# Patient Record
Sex: Female | Born: 1960 | Race: Black or African American | Hispanic: No | Marital: Married
Health system: Southern US, Community
[De-identification: ages and names within clinical notes are randomized; demographics above are authoritative.]

---

## 1998-10-14 ENCOUNTER — Ambulatory Visit (HOSPITAL_COMMUNITY)
Admission: RE | Admit: 1998-10-14 | Discharge: 1998-10-14 | Payer: Self-pay | Admitting: Physical Medicine & Rehabilitation

## 1998-10-14 ENCOUNTER — Encounter: Payer: Self-pay | Admitting: Physical Medicine & Rehabilitation

## 1998-10-21 ENCOUNTER — Encounter: Payer: Self-pay | Admitting: Physical Medicine & Rehabilitation

## 1998-10-21 ENCOUNTER — Ambulatory Visit (HOSPITAL_COMMUNITY)
Admission: RE | Admit: 1998-10-21 | Discharge: 1998-10-21 | Payer: Self-pay | Admitting: Physical Medicine & Rehabilitation

## 1998-11-07 ENCOUNTER — Ambulatory Visit (HOSPITAL_COMMUNITY): Admission: RE | Admit: 1998-11-07 | Discharge: 1998-11-07 | Payer: Self-pay | Admitting: Obstetrics and Gynecology

## 1998-11-07 ENCOUNTER — Encounter: Payer: Self-pay | Admitting: Physical Medicine & Rehabilitation

## 1998-11-21 ENCOUNTER — Encounter: Payer: Self-pay | Admitting: Physical Medicine & Rehabilitation

## 1998-11-21 ENCOUNTER — Ambulatory Visit (HOSPITAL_COMMUNITY)
Admission: RE | Admit: 1998-11-21 | Discharge: 1998-11-21 | Payer: Self-pay | Admitting: Physical Medicine & Rehabilitation

## 2003-02-07 ENCOUNTER — Encounter
Admission: RE | Admit: 2003-02-07 | Discharge: 2003-05-08 | Payer: Self-pay | Admitting: Physical Medicine & Rehabilitation

## 2003-05-17 ENCOUNTER — Encounter
Admission: RE | Admit: 2003-05-17 | Discharge: 2003-08-15 | Payer: Self-pay | Admitting: Physical Medicine & Rehabilitation

## 2003-09-17 ENCOUNTER — Encounter
Admission: RE | Admit: 2003-09-17 | Discharge: 2003-12-16 | Payer: Self-pay | Admitting: Physical Medicine & Rehabilitation

## 2004-01-16 ENCOUNTER — Encounter
Admission: RE | Admit: 2004-01-16 | Discharge: 2004-04-15 | Payer: Self-pay | Admitting: Physical Medicine & Rehabilitation

## 2004-04-13 ENCOUNTER — Encounter
Admission: RE | Admit: 2004-04-13 | Discharge: 2004-07-12 | Payer: Self-pay | Admitting: Physical Medicine & Rehabilitation

## 2004-07-14 ENCOUNTER — Encounter
Admission: RE | Admit: 2004-07-14 | Discharge: 2004-10-12 | Payer: Self-pay | Admitting: Physical Medicine & Rehabilitation

## 2004-07-15 ENCOUNTER — Ambulatory Visit: Payer: Self-pay | Admitting: Physical Medicine & Rehabilitation

## 2004-10-14 ENCOUNTER — Encounter
Admission: RE | Admit: 2004-10-14 | Discharge: 2005-01-12 | Payer: Self-pay | Admitting: Physical Medicine & Rehabilitation

## 2004-10-14 ENCOUNTER — Ambulatory Visit: Payer: Self-pay | Admitting: Physical Medicine & Rehabilitation

## 2005-02-03 ENCOUNTER — Encounter
Admission: RE | Admit: 2005-02-03 | Discharge: 2005-05-04 | Payer: Self-pay | Admitting: Physical Medicine & Rehabilitation

## 2005-02-10 ENCOUNTER — Ambulatory Visit: Payer: Self-pay | Admitting: Physical Medicine & Rehabilitation

## 2005-03-11 ENCOUNTER — Ambulatory Visit (HOSPITAL_COMMUNITY): Admission: RE | Admit: 2005-03-11 | Discharge: 2005-03-11 | Payer: Self-pay | Admitting: Obstetrics and Gynecology

## 2005-06-09 ENCOUNTER — Ambulatory Visit: Payer: Self-pay | Admitting: Physical Medicine & Rehabilitation

## 2005-06-09 ENCOUNTER — Encounter
Admission: RE | Admit: 2005-06-09 | Discharge: 2005-09-07 | Payer: Self-pay | Admitting: Physical Medicine & Rehabilitation

## 2005-10-05 ENCOUNTER — Encounter
Admission: RE | Admit: 2005-10-05 | Discharge: 2006-01-03 | Payer: Self-pay | Admitting: Physical Medicine & Rehabilitation

## 2005-10-13 ENCOUNTER — Ambulatory Visit: Payer: Self-pay | Admitting: Physical Medicine & Rehabilitation

## 2005-11-09 ENCOUNTER — Encounter: Admission: RE | Admit: 2005-11-09 | Discharge: 2005-11-09 | Payer: Self-pay | Admitting: Cardiovascular Disease

## 2006-02-10 ENCOUNTER — Ambulatory Visit: Payer: Self-pay | Admitting: Physical Medicine & Rehabilitation

## 2006-02-10 ENCOUNTER — Encounter
Admission: RE | Admit: 2006-02-10 | Discharge: 2006-05-11 | Payer: Self-pay | Admitting: Physical Medicine & Rehabilitation

## 2006-04-19 ENCOUNTER — Emergency Department (HOSPITAL_COMMUNITY): Admission: EM | Admit: 2006-04-19 | Discharge: 2006-04-19 | Payer: Self-pay | Admitting: Emergency Medicine

## 2006-04-20 ENCOUNTER — Emergency Department (HOSPITAL_COMMUNITY): Admission: EM | Admit: 2006-04-20 | Discharge: 2006-04-20 | Payer: Self-pay | Admitting: Emergency Medicine

## 2006-04-20 ENCOUNTER — Ambulatory Visit: Payer: Self-pay | Admitting: Physical Medicine & Rehabilitation

## 2006-04-23 ENCOUNTER — Ambulatory Visit (HOSPITAL_COMMUNITY)
Admission: RE | Admit: 2006-04-23 | Discharge: 2006-04-23 | Payer: Self-pay | Admitting: Physical Medicine & Rehabilitation

## 2006-06-08 ENCOUNTER — Encounter
Admission: RE | Admit: 2006-06-08 | Discharge: 2006-09-06 | Payer: Self-pay | Admitting: Physical Medicine & Rehabilitation

## 2006-06-08 ENCOUNTER — Ambulatory Visit: Payer: Self-pay | Admitting: Physical Medicine & Rehabilitation

## 2006-08-05 ENCOUNTER — Ambulatory Visit: Payer: Self-pay | Admitting: Physical Medicine & Rehabilitation

## 2006-10-21 ENCOUNTER — Ambulatory Visit: Payer: Self-pay | Admitting: Physical Medicine & Rehabilitation

## 2006-10-21 ENCOUNTER — Encounter
Admission: RE | Admit: 2006-10-21 | Discharge: 2007-01-19 | Payer: Self-pay | Admitting: Physical Medicine & Rehabilitation

## 2006-10-27 ENCOUNTER — Ambulatory Visit (HOSPITAL_COMMUNITY)
Admission: RE | Admit: 2006-10-27 | Discharge: 2006-10-27 | Payer: Self-pay | Admitting: Physical Medicine & Rehabilitation

## 2006-12-13 ENCOUNTER — Ambulatory Visit: Payer: Self-pay | Admitting: Physical Medicine & Rehabilitation

## 2006-12-28 ENCOUNTER — Encounter: Admission: RE | Admit: 2006-12-28 | Discharge: 2006-12-28 | Payer: Self-pay | Admitting: Cardiovascular Disease

## 2007-02-17 ENCOUNTER — Encounter (INDEPENDENT_AMBULATORY_CARE_PROVIDER_SITE_OTHER): Payer: Self-pay | Admitting: Cardiovascular Disease

## 2007-02-17 ENCOUNTER — Inpatient Hospital Stay (HOSPITAL_COMMUNITY): Admission: AD | Admit: 2007-02-17 | Discharge: 2007-02-20 | Payer: Self-pay | Admitting: Cardiovascular Disease

## 2007-02-17 ENCOUNTER — Ambulatory Visit: Payer: Self-pay | Admitting: Vascular Surgery

## 2007-03-13 ENCOUNTER — Encounter
Admission: RE | Admit: 2007-03-13 | Discharge: 2007-03-15 | Payer: Self-pay | Admitting: Physical Medicine & Rehabilitation

## 2007-03-15 ENCOUNTER — Ambulatory Visit: Payer: Self-pay | Admitting: Physical Medicine & Rehabilitation

## 2007-06-22 ENCOUNTER — Encounter: Admission: RE | Admit: 2007-06-22 | Discharge: 2007-06-22 | Payer: Self-pay | Admitting: Cardiovascular Disease

## 2007-07-11 ENCOUNTER — Encounter
Admission: RE | Admit: 2007-07-11 | Discharge: 2007-08-24 | Payer: Self-pay | Admitting: Physical Medicine & Rehabilitation

## 2007-07-12 ENCOUNTER — Ambulatory Visit: Payer: Self-pay | Admitting: Physical Medicine & Rehabilitation

## 2007-08-22 ENCOUNTER — Emergency Department (HOSPITAL_COMMUNITY): Admission: EM | Admit: 2007-08-22 | Discharge: 2007-08-22 | Payer: Self-pay | Admitting: Emergency Medicine

## 2007-08-22 ENCOUNTER — Ambulatory Visit: Payer: Self-pay | Admitting: Physical Medicine & Rehabilitation

## 2007-11-07 ENCOUNTER — Ambulatory Visit: Payer: Self-pay | Admitting: Physical Medicine & Rehabilitation

## 2007-11-07 ENCOUNTER — Encounter
Admission: RE | Admit: 2007-11-07 | Discharge: 2007-11-08 | Payer: Self-pay | Admitting: Physical Medicine & Rehabilitation

## 2007-12-13 ENCOUNTER — Emergency Department (HOSPITAL_COMMUNITY): Admission: EM | Admit: 2007-12-13 | Discharge: 2007-12-13 | Payer: Self-pay | Admitting: Emergency Medicine

## 2008-02-26 ENCOUNTER — Encounter
Admission: RE | Admit: 2008-02-26 | Discharge: 2008-02-28 | Payer: Self-pay | Admitting: Physical Medicine & Rehabilitation

## 2008-02-28 ENCOUNTER — Ambulatory Visit: Payer: Self-pay | Admitting: Physical Medicine & Rehabilitation

## 2008-05-08 ENCOUNTER — Emergency Department (HOSPITAL_COMMUNITY): Admission: EM | Admit: 2008-05-08 | Discharge: 2008-05-08 | Payer: Self-pay | Admitting: Emergency Medicine

## 2008-06-26 ENCOUNTER — Encounter
Admission: RE | Admit: 2008-06-26 | Discharge: 2008-09-19 | Payer: Self-pay | Admitting: Physical Medicine & Rehabilitation

## 2008-06-27 ENCOUNTER — Ambulatory Visit: Payer: Self-pay | Admitting: Physical Medicine & Rehabilitation

## 2008-08-20 ENCOUNTER — Observation Stay (HOSPITAL_COMMUNITY): Admission: EM | Admit: 2008-08-20 | Discharge: 2008-08-22 | Payer: Self-pay | Admitting: Emergency Medicine

## 2008-09-12 ENCOUNTER — Inpatient Hospital Stay (HOSPITAL_COMMUNITY): Admission: EM | Admit: 2008-09-12 | Discharge: 2008-09-14 | Payer: Self-pay | Admitting: Emergency Medicine

## 2008-09-12 ENCOUNTER — Ambulatory Visit: Payer: Self-pay | Admitting: Internal Medicine

## 2008-09-13 ENCOUNTER — Encounter: Payer: Self-pay | Admitting: Internal Medicine

## 2008-09-17 ENCOUNTER — Emergency Department (HOSPITAL_COMMUNITY): Admission: EM | Admit: 2008-09-17 | Discharge: 2008-09-17 | Payer: Self-pay | Admitting: Emergency Medicine

## 2008-09-19 ENCOUNTER — Ambulatory Visit: Payer: Self-pay | Admitting: Physical Medicine & Rehabilitation

## 2008-10-18 ENCOUNTER — Encounter
Admission: RE | Admit: 2008-10-18 | Discharge: 2008-10-21 | Payer: Self-pay | Admitting: Physical Medicine & Rehabilitation

## 2008-10-21 ENCOUNTER — Ambulatory Visit: Payer: Self-pay | Admitting: Physical Medicine & Rehabilitation

## 2008-11-29 ENCOUNTER — Emergency Department (HOSPITAL_COMMUNITY): Admission: EM | Admit: 2008-11-29 | Discharge: 2008-11-29 | Payer: Self-pay | Admitting: Emergency Medicine

## 2009-05-14 ENCOUNTER — Encounter: Admission: RE | Admit: 2009-05-14 | Discharge: 2009-05-14 | Payer: Self-pay | Admitting: Internal Medicine

## 2009-09-10 ENCOUNTER — Encounter: Admission: RE | Admit: 2009-09-10 | Discharge: 2009-12-09 | Payer: Self-pay | Admitting: Internal Medicine

## 2009-10-24 ENCOUNTER — Emergency Department (HOSPITAL_COMMUNITY): Admission: EM | Admit: 2009-10-24 | Discharge: 2009-10-24 | Payer: Self-pay | Admitting: Emergency Medicine

## 2009-11-06 ENCOUNTER — Emergency Department (HOSPITAL_COMMUNITY): Admission: EM | Admit: 2009-11-06 | Discharge: 2009-11-06 | Payer: Self-pay | Admitting: Emergency Medicine

## 2010-01-01 ENCOUNTER — Encounter: Admission: RE | Admit: 2010-01-01 | Discharge: 2010-01-01 | Payer: Self-pay | Admitting: Neurosurgery

## 2010-12-17 LAB — URINE MICROSCOPIC-ADD ON

## 2010-12-17 LAB — DIFFERENTIAL
Basophils Absolute: 0 10*3/uL (ref 0.0–0.1)
Lymphocytes Relative: 18 % (ref 12–46)
Lymphs Abs: 0.7 10*3/uL (ref 0.7–4.0)
Monocytes Absolute: 0.2 10*3/uL (ref 0.1–1.0)
Monocytes Relative: 4 % (ref 3–12)
Neutro Abs: 3.3 10*3/uL (ref 1.7–7.7)
Neutrophils Relative %: 78 % — ABNORMAL HIGH (ref 43–77)

## 2010-12-17 LAB — COMPREHENSIVE METABOLIC PANEL
AST: 27 U/L (ref 0–37)
CO2: 25 mEq/L (ref 19–32)
Calcium: 9.9 mg/dL (ref 8.4–10.5)
Chloride: 104 mEq/L (ref 96–112)
GFR calc Af Amer: >60 mL/min (ref >60)
Glucose, Bld: 129 mg/dL — ABNORMAL HIGH (ref 70–99)
Potassium: 3.8 mEq/L (ref 3.5–5.1)
Total Protein: 7.1 g/dL (ref 6.0–8.3)

## 2010-12-17 LAB — POCT CARDIAC MARKERS
CKMB, poc: <1 ng/mL — ABNORMAL LOW (ref 1.0–8.0)
Myoglobin, poc: 47.9 ng/mL (ref 12–200)
Troponin i, poc: <0.05 ng/mL (ref 0.00–0.09)

## 2010-12-17 LAB — CBC
Hemoglobin: 15.9 g/dL — ABNORMAL HIGH (ref 12.0–15.0)
Platelets: 218 10*3/uL (ref 150–400)

## 2010-12-17 LAB — URINALYSIS, ROUTINE W REFLEX MICROSCOPIC

## 2010-12-21 LAB — DIFFERENTIAL
Basophils Absolute: 0 10*3/uL (ref 0.0–0.1)
Basophils Relative: 0 % (ref 0–1)
Eosinophils Absolute: 0 10*3/uL (ref 0.0–0.7)
Eosinophils Relative: 0 % (ref 0–5)
Lymphocytes Relative: 18 % (ref 12–46)
Lymphs Abs: 1.1 10*3/uL (ref 0.7–4.0)
Monocytes Absolute: 0.2 10*3/uL (ref 0.1–1.0)
Monocytes Relative: 3 % (ref 3–12)
Neutrophils Relative %: 79 % — ABNORMAL HIGH (ref 43–77)

## 2010-12-21 LAB — CBC
HCT: 44.2 % (ref 36.0–46.0)
MCHC: 32.8 g/dL (ref 30.0–36.0)
MCV: 83.2 fL (ref 78.0–100.0)
Platelets: 231 10*3/uL (ref 150–400)
RBC: 5.53 MIL/uL — ABNORMAL HIGH (ref 3.87–5.11)
RDW: 14.3 % (ref 11.5–15.5)
WBC: 5.6 10*3/uL (ref 4.0–10.5)

## 2010-12-21 LAB — URINALYSIS, ROUTINE W REFLEX MICROSCOPIC
Glucose, UA: NEGATIVE mg/dL
Nitrite: NEGATIVE
Nitrite: NEGATIVE
Protein, ur: NEGATIVE mg/dL
Specific Gravity, Urine: 1.022 (ref 1.005–1.030)
pH: 8 (ref 5.0–8.0)
pH: 8.5 — ABNORMAL HIGH (ref 5.0–8.0)

## 2010-12-21 LAB — COMPREHENSIVE METABOLIC PANEL
ALT: 35 U/L (ref 0–35)
AST: 20 U/L (ref 0–37)
AST: 25 U/L (ref 0–37)
Albumin: 3.9 g/dL (ref 3.5–5.2)
Alkaline Phosphatase: 70 U/L (ref 39–117)
BUN: 3 mg/dL — ABNORMAL LOW (ref 6–23)
CO2: 25 mEq/L (ref 19–32)
Calcium: 9.9 mg/dL (ref 8.4–10.5)
Chloride: 102 mEq/L (ref 96–112)
Creatinine, Ser: 0.68 mg/dL (ref 0.4–1.2)
GFR calc Af Amer: >60 mL/min (ref >60)
GFR calc Af Amer: >60 mL/min (ref >60)
GFR calc non Af Amer: >60 mL/min (ref >60)
Glucose, Bld: 176 mg/dL — ABNORMAL HIGH (ref 70–99)
Potassium: 3.4 mEq/L — ABNORMAL LOW (ref 3.5–5.1)
Sodium: 139 mEq/L (ref 135–145)
Total Bilirubin: 0.9 mg/dL (ref 0.3–1.2)

## 2010-12-21 LAB — URINE MICROSCOPIC-ADD ON

## 2010-12-21 LAB — BASIC METABOLIC PANEL
BUN: 1 mg/dL — ABNORMAL LOW (ref 6–23)
Calcium: 8.5 mg/dL (ref 8.4–10.5)
GFR calc non Af Amer: >60 mL/min (ref >60)
Glucose, Bld: 114 mg/dL — ABNORMAL HIGH (ref 70–99)

## 2010-12-21 LAB — LIPID PANEL: HDL: 32 mg/dL — ABNORMAL LOW (ref >39)

## 2010-12-21 LAB — LIPASE, BLOOD: Lipase: 17 U/L (ref 11–59)

## 2011-01-19 NOTE — Assessment & Plan Note (Signed)
Jennifer Miles returns to clinic for followup evaluation.  She reports she  has been experiencing severe pain of her right shoulder and neck, along  with numbness of her hands, that is worse with any activity, whatsoever.  We have evaluated her previously with MRI scans of her lumbar spine, but  she now has undergone an MRI scan of her cervical spine.  She has seen  her cardiologists and they do not feel that this is cardiac-related.  They are suspicious of degenerative joint disease of the cervical spine,  as she has in the lumbar spine.   She has been using OxyContin sustained release 40 mg t.i.d.  Her total  dose of OxyContin has increased from 20 b.i.d. to 40 t.i.d. over the  past eight months.  She also uses the Roxicodone approximately four  times per day.  She does need refills on each of those in the office,  along with her Ultram and Flexeril.   MEDICATIONS:  1. OxyContin sustained release 40 mg t.i.d.  2. Ultram 50 mg two tablets t.i.d. p.r.n.  3. Xanax p.r.n.  4. Metoprolol daily.  5. Lidoderm patches p.r.n.  6. Flexeril 10 mg t.i.d.  7. Roxicodone 50 mg q.i.d. p.r.n.   REVIEW OF SYSTEMS:  Positive for limb swelling, abdominal pain, nausea,  vomiting, poor appetite and weight-gain.   PHYSICAL EXAM:  Reasonably well-appearing adult female, in mild acute  discomfort.  VITALS:  Were not obtained in the office today.  She ambulates with a single-point cane.  She has 4-/5 strength  throughout the bilateral upper and lower extremities.   IMPRESSION:  1. Chronic low back pain, status post lumbar fusion, L3-L4, December      1998.  2. Lumbar spondylosis.  3. Venous insufficiency of the lower extremities.  4. Right neck and shoulder pain, along with numbness of the hands.   In the office today, the patient is awaiting the results of the MRI scan  of her cervical spine, which was completed by her cardiologist.  We will  refill her Flexeril, along with her Ultram, as of today,  and then her  OxyContin and Roxicodone as of November 21.  We will not make any  adjustments in the total dose at this time, as she has had substantial  increase over the past several months.  We will plan on seeing her in  followup in approximately three to four months' time with refills prior  to that appointment as necessary.           ______________________________  Ellwood Dense, M.D.     DC/MedQ  D:  07/12/2007 10:00:45  T:  07/12/2007 16:37:25  Job #:  [id number]

## 2011-01-19 NOTE — H&P (Signed)
NAME:  Jennifer Miles, Jennifer Miles             ACCOUNT NO.:  [id number]   MEDICAL RECORD NO.:  [id number]          PATIENT TYPE:  EMS   LOCATION:  ED                           FACILITY[facility]   PHYSICIAN:  Hillery Aldo, M.D.   DATE OF BIRTH:  [birth year]   DATE OF ADMISSION:  09/12/2008  DATE OF DISCHARGE:                              HISTORY & PHYSICAL   PRIMARY CARE PHYSICIAN:  None.   PAIN MANAGEMENT PHYSICIAN:  Dr. Thomasena Edis.   CARDIOLOGIST:  Dr. Algie Coffer.   CHIEF COMPLAINT:  Persistent nausea and vomiting.   HISTORY OF PRESENT ILLNESS:  Patient is a 50 year old female with a past  medical history of chronic pain on high-dose chronic narcotics who  presents to the hospital with a chief complaint of recurrent onset of  nausea and vomiting.  Patient has had a similar admission for persistent  nausea and vomiting approximately 3 to 4 weeks ago.  Patient began to  experience the sudden onset of nausea and vomiting last evening.  She  denies any significant abdominal pain or associated diarrhea.  Patient  was discharged on Reglan 10 mg q.a.c. and h.s. but admits that she has  only been taking half the prescribed amount secondary to being afraid of  the side effects.  She denies any sick contacts.  The last solid food  that she has been able to keep down was yesterday morning for breakfast,  she was able to eat some oatmeal which stayed down.  She has not been  able to tolerate any solid foods since that time.  At this point, the  patient is now simply having dry heaves.  She was evaluated by the  emergency department physician and referred to the hospitalist service  for admission.   PAST MEDICAL HISTORY:  1. Chronic narcotic dependency/chronic pain syndrome.  2. Paroxysmal atrial fibrillation.  3. Gastroesophageal reflux disease.  4. Dyslipidemia.  5. Chronic venous insufficiency of the lower extremities.  6. History of depression and anxiety.  7. Chronic lower back pain status post  lumbar fusion, L3-L4, in      December 1998.  8. Lumbar spondylosis.  9. Cervical degenerative disk disease.   PAST SURGICAL HISTORY:  1. Cholecystectomy.  2. Tubal ligation.  3. Fibroid removal surgery.  4. Lumbar fusion, L3-L4, December 1998.   FAMILY HISTORY:  Patient's mother died at age 92 from a brain  hemorrhage.  Patient's father is alive at age 1 and is reportedly  healthy.  She has 6 healthy sisters.  She has 3 offspring that are  healthy.  Apparently she has 1 cousin who has had similar GI symptoms.   SOCIAL HISTORY:  Patient is married and lives in [place] with her  husband.  She quit smoking 3 weeks ago when she was admitted to the  hospital.  Prior to this, she smoked approximately a 1/4 pack of tobacco  daily.  She denies any alcohol or illicit drug use.  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   ALLERGIES:  NO KNOWN DRUG ALLERGIES.  CURRENT MEDICATIONS:  1. Roxicodone 15 mg up to 7 times per day.  2. Dilaudid 2 mg every 4 hours needed.  3. Cymbalta 30 mg t.i.d.  4. Lopressor 50 mg b.i.d.  5. OxyContin 40 mg q.i.d.  6. Prevacid 20 mg daily.  7. Vitamin B12 daily.  8. Xanax 1 mg t.i.d. p.r.n.  9. Ambien 5 mg q.h.s. p.r.n.  10.Lasix 40 mg daily as needed for lower extremity swelling.  11.Advil 400 mg every 4 hours p.r.n.  12.Reglan 10 mg q.a.c. and h.s. (patient has been taking half of the      prescribed amount).   REVIEW OF SYSTEMS:  CONSTITUTIONAL:  Patient reports 6-pound weight loss  in the past several weeks.  She has had no fever but intermittent  chills.  HEENT:  Patient denies any eye, ear, nose, or throat symptoms.  She does wear contacts and has an upper denture.  GI:  Nausea and  vomiting as noted in the HPI.  She has not had any diarrhea, melena, or  hematochezia.  INTEGUMENTARY:  Patient denies any skin rashes or skin  problems.  ENDOCRINE:  Patient denies any heat or cold  intolerance.  She  denies polyuria and polydipsia.  GENITOURINARY:  Patient denies dysuria  and hematuria.  HEME/LYMPH:  Patient denies any lymphadenopathy or  history of anemia.  CARDIOVASCULAR:  Patient has a history of paroxysmal  atrial fibrillation for which she sees Dr. Algie Coffer and who prescribes  the metoprolol for her.  She has chronic lower extremity swelling for  which she takes p.r.n. Lasix.  MUSCULOSKELETAL:  Patient has chronic  neck and shoulder pain.  NEUROLOGIC:  Patient denies headache except  with episodes of intractable vomiting.  No history of seizures.  RESPIRATORY:  Patient denies any shortness of breath or cough.  PSYCH:  Patient admits to chronic depression and anxiety.   PHYSICAL EXAM:  Temperature 98.5.  Pulse 55.  Respirations 18.  Blood  pressure 138/77.  O2 saturation 98% on room air.  GENERAL:  Well-developed, well-nourished female who appears in mild  distress secondary to paroxysms of dry heaves.  HEENT:  Normocephalic, atraumatic.  PERRL.  Sclerae nonicteric.  There  is mild arcus senilis bilaterally.  Extraocular movements are intact.  Nares are without discharge.  Oropharynx is clear.  She does have an  upper denture in place.  NECK:  Supple, no thyromegaly, no lymphadenopathy, no jugular venous  distention.  CHEST:  Lungs clear to auscultation bilaterally with good air movement.  HEART:  Regular rate and rhythm.  No murmurs, rubs, or gallops.  ABDOMEN:  Soft, nontender, nondistended with decreased but present bowel  sounds in all 4 quadrants.  EXTREMITIES:  No appreciable clubbing, edema, or cyanosis.  SKIN:  Dry.  No rashes.  NEUROLOGIC:  Patient is alert and oriented x3.  Cranial nerves II-XII  are grossly intact.  Moves all extremities x4 with equal strength.   DATA REVIEW:  Acute abdominal series shows no acute abdominal or  pulmonary abnormalities.   LABORATORY DATA:  White blood cell count is 6.3, hemoglobin 15.1,  hematocrit 46, platelets  283.  Sodium is 140, potassium 3.5, chloride  104, bicarb 25, BUN 5, creatinine 0.868, glucose 176.  Liver function  studies are within normal limits.  Lipase is 17.  Urinalysis is positive  for ketones, 3 to 6 white blood cells, and few bacteria.   ASSESSMENT AND PLAN:  1. 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      We can also attempt to obtain a gastric emptying study during this      hospitalization.  The patient was unable to tolerate the study      preparation on her prior admission.  In the meantime, we will give      her antiemetics and put her on intravenous Reglan.  2. Hyperglycemia:  Patient has no known history of diabetes.  We will      check a hemoglobin A1c value and repeat a fasting glucose in the      morning.  3. Pyuria/bacteriuria:  Patient denies any dysuria or hematuria.  We      will send a urine off for culture and treat her with antibiotics if      she has significant bacterial growth.  4. Chronic pain with chronic narcotics dependency:  We will provide      the patient with Dilaudid intravenous as needed to help prevent      withdrawal phenomenon.  5. Paroxysmal atrial fibrillation:  We will give the patient      metoprolol intravenous until she is able to tolerate her p.o. dose.  6. Gastroesophageal reflux disease:  We will place the patient on high-      dose proton pump inhibitor therapy.  7. History of dyslipidemia:  We will check a fasting lipid panel in      the a.m.  8. Depression/anxiety:  We will hold the patient's Cymbalta and Xanax      for now.  We will provide her with intravenous Ativan as needed for      symptom control.  9. Prophylaxis:  We will initiate subcutaneous heparin for deep venous      thrombosis  prophylaxis and proton pump inhibitor therapy for      gastrointestinal stress ulcer prophylaxis.   Time spent gathering data, reviewing data, examining the patient, and  formulating a plan for treatment equaled approximately 1 hour.      Hillery Aldo, M.D.  Electronically Signed     CR/MEDQ  D:  09/12/2008  T:  09/12/2008  Job:  [id number]   cc:   Ellwood Dense, M.D.  Fax: [phone]   Ricki Rodriguez, M.D.  Fax: [phone]

## 2011-01-19 NOTE — Assessment & Plan Note (Signed)
Ms. Stauder returns to clinic today for followup evaluation.  Overall,  she is doing well.  She does report that she is getting better relief  using the Roxicodone up to 6 tablets per day compared to the 5 tablets  previously prescribed.  She reports that she cannot do without the pain  medicines in her daily life.  She has sufficient supply of Flexeril,  Ultram, and Lidoderm patches at this time.  She continues to use a  single-point cane outside her home, but generally goes without the cane  inside the home.  She does use an occasional power wheelchair, which is  good her for longer distance mobility.   MEDICATIONS:  1. OxyContin sustained release 40 mg q.i.d.  2. Roxicodone 15 mg 1-2 tablets p.o. t.i.d. p.r.n.  3. Ultram 50 mg 2 tablets t.i.d. p.r.n.  4. Xanax p.r.n.  5. Metoprolol daily.  6. Lidoderm patches p.r.n.  7. Flexeril 10 mg b.i.d. p.r.n.  8. Lasix approximately 2 times per week p.r.n.  9. Potassium chloride p.r.n.   REVIEW OF SYSTEMS:  Noncontributory.   PHYSICAL EXAMINATION:  GENERAL:  Middle-aged adult female in mild-to-no  acute discomfort.  VITAL SIGNS:  Blood pressure 117/55 with a pulse of 87, respiratory rate  18, and O2 saturation 97% on room air.  EXTREMITIES:  She has 4+/5 strength throughout the bilateral upper  extremities and 4-/5 strength in the bilateral lower extremities. She  ambulates with a single-point cane.  She has decreased lumbar range of  motion in all planes.   IMPRESSION:  1. Chronic low back pain, status post lumbar fusion at L3-L4, December      1998.  2. Lumbar spondylosis.  3. Reported cervical degenerative disk disease/herniated nucleus      pulposus.  4. Venous insufficiency of the lower extremities.   In the office today, we did refill the patient's Roxicodone and  OxyContin as of March 08, 2008.  She will keep these scripts and refilled  them on that date.  We will plan on seeing her in follow up in  approximately 3-4 months'  time with refills prior to that appointment as  necessary.           ______________________________  Ellwood Dense, M.D.     DC/MedQ  D:  02/28/2008 10:22:16  T:  02/29/2008 07:49:27  Job #:  [id number]

## 2011-01-19 NOTE — Assessment & Plan Note (Signed)
Jennifer Miles returns to clinic today for followup evaluation.  There is an  indication that she was hospitalized for nausea and vomiting back on  September 12, 2008.  When I last saw in this office on June 27, 2008, we  had increased her OxyContin from 40 mg q.i.d. (160 mg daily) to 60 mg  t.i.d. (180 mg daily).  She reports that she did not take the 60 mg 3  times per day, but instead start using it 4 times per day.  That is an  increase from 160-240 mg acutely.  She was hospitalized with a nausea  and vomiting and they felt that she had gastroparesis related to  narcotic overuse/overdose.  She also continues to use of oxycodone  approximately 7 tablets per day and has increased that on her own  periodically.   I warned her against the misuse of the medication repeatedly in the past  and again in the office today.  If there are any further abnormalities,  we will may need to discharge her from this clinic.  I have expressed to  her that at this point we will continue to see her, but she needs to be  very compliant or else risk dismissal from the clinic.  We have decided  to decrease her OxyContin back to 40 mg q.i.d. instead of 60 mg t.i.d.  We will refill the oxycodone and the OxyContin in the office today.  She  has been told explicitly that she needs to take the medication only as  prescribed without overuse.   REVIEW OF SYSTEMS:  Positive for nausea and vomiting.   MEDICATIONS:  1. OxyContin sustained release 60 mg t.i.d.  2. Oxycodone immediate release 15 mg 1-2 tablets p.o. t.i.d. p.r.n.      and 1 tablet nightly p.r.n.  3. Ultram 50 mg 2 tablets t.i.d. p.r.n.  4. Xanax p.r.n.  5. Metoprolol daily.  6. Lidoderm patches p.r.n.  7. Flexeril 10 mg b.i.d. p.r.n.  8. Lasix approximately 2 times per week p.r.n.  9. Potassium chloride p.r.n.   PHYSICAL EXAMINATION:  GENERAL:  A middle-aged adult female in mild-to-  no acute discomfort.  VITAL SIGNS:  Blood pressure is 138/56 with  a pulse of 100, respiratory  rate is 18, and O2 saturation 99% is on room air.  MUSCULOSKELETAL:  She ambulates with a single-point cane.  She has  decreased range of motion of her lumbar spine throughout.   IMPRESSION:  1. Chronic low back pain, status post lumbar fusion L3-L4 on August 24, 1997.  2. Lumbar spondylosis.  3. Reported cervical degenerative disk disease/herniated nucleus      pulposus.  4. Venous insufficiency of the bilateral lower extremities.   In the office today, we did refill the patient's oxycodone 15 mg,  maximum 7 per day.  We also refilled her OxyContin CR at 40 mg q.i.d.,  which is decreased from her previous dose.  I have warned her again  against this use of medication.  We will plan on seeing her in follow up  in 1 month's time either with myself or with the nurse.          ______________________________  Ellwood Dense, M.D.    DC/MedQ  D:  09/19/2008 10:22:01  T:  09/20/2008 00:49:39  Job #:  [id number]

## 2011-01-19 NOTE — Discharge Summary (Signed)
NAME:  Jennifer Miles, Jennifer Miles             ACCOUNT NO.:  [id number]   MEDICAL RECORD NO.:  [id number]          PATIENT TYPE:  INP   LOCATION:  3739                         FACILITY:  [facility]   PHYSICIAN:  Ricki Rodriguez, M.D.  DATE OF BIRTH:  [birth year]   DATE OF ADMISSION:  02/17/2007  DATE OF DISCHARGE:  02/20/2007                               DISCHARGE SUMMARY   FINAL DIAGNOSES:  1. Bilateral leg edema.  2. Venous insufficiency.  3. Back pain.  4. Hyperlipidemia.  5. Tobacco use disorder.   DISCHARGE MEDICATIONS:  Xanax 1 mg daily, metoprolol 50 mg one twice  daily, Cymbalta 30 mg twice daily, Lasix 40 mg one daily as needed,  potassium 20 mEq one daily with Lasix use, Crestor 2.5 mg one daily,  oxycodone 40 mg extended release as directed, promethazine 25 mg up to 4  times daily as needed for nausea, multivitamin one daily, B complex one  daily.   DISCHARGE ACTIVITY:  The patient is to increase activity slowly.   DISCHARGE DIET:  Low-sodium heart-healthy diet with 1500 mL fluid  restriction per day.   SPECIAL INSTRUCTIONS:  The patient to apply support stocking as  tolerated and elevate the leg as tolerated.   HISTORY:  The patient is a 51 year old black female who presented with  bilateral leg edema along with tenderness of both lower extremities.  The patient denied any chest pain, shortness of breath, fever.  The  patient had no prior history of DVT, no history of injuries.   PHYSICAL EXAMINATION:  VITAL SIGNS:  Temperature 98.5, pulse 84,  respirations 20, blood pressure 124/71, height 5 feet 9 inches, weight  90 kg, oxygen saturation 95% on room air.  GENERAL:  The patient is averagely built and nourished black female in  no acute distress.  HEENT: The patient is normocephalic, atraumatic.  She has brown eyes,  pupils equally reacting to light.  Extraocular movement intact.  NECK:  No JVD, no carotid bruit.  LUNGS:  Clear bilaterally.  HEART:  Normal S1-S2.  ABDOMEN:   Soft and nontender.  EXTREMITIES: 2+ edema with tenderness.  No cyanosis or clubbing.  SKIN:  Warm and dry.  NEUROLOGICALLY:  Cranial nerves grossly intact and the patient has  bilateral equal grips.   LABORATORY DATA:  Normal electrolytes, BUN, creatinine.  Glucose  borderline at 106.  Hemoglobin/hematocrit normal, WBC and platelet count  normal.  Thyroid stimulating hormone normal.  B natruretic peptide 54,  normal.  Cholesterol: slightly elevated LDL cholesterol, triglycerides  241 mg/dL and HDL somewhat low at 29 mg/dL.  INR 1, CK-MB normal.   Chest X-Ray: No active lung disease.   X-ray of abdomen:  Moderate to large amount of feces throughout the  colon consistent with constipation, otherwise no obstruction or free  air.   CT of abdomen negative for obstructive lesion or mass.   EKG normal sinus rhythm.   HOSPITAL COURSE:  The patient was admitted to telemetry unit.  She  received IV Lasix with good diuresis in 3 days.  Her leg edema had  decreased by 80-90%, she was able to  ambulate well.  She had a venous  Doppler study that was negative for DVT.  On February 20, 2007 she was  discharged home in satisfactory condition with a follow-up by me in 2  weeks.      Ricki Rodriguez, M.D.  Electronically Signed     ASK/MEDQ  D:  02/20/2007  T:  02/20/2007  Job:  [id number]

## 2011-01-19 NOTE — Assessment & Plan Note (Signed)
Ms. Ytuarte returns to the clinic today for followup evaluation.  We last  saw her in this office on February 28, 2008.  At that time, she continued on  OxyContin sustained release 40 mg q.i.d. and Roxicodone 15 mg generally  6 per day on an as-needed basis.  She reports now that she has been  taking her OxyContin and her oxycodone with only minimal relief.  She  reports that many days she was unable to get out of bed, but on other  days she was able to take a trip by a camper up to [place] with her  family.  She reports that on the trip, she had much less decreased pain  and was doing no lifting or any particular activity.  She reports that  then 2 weeks ago after returning from her visit, she had sudden onset  of increased back pain.  She reports that that has been substantially  interfering with her mobility and her activities of daily living.  She  would like to have an adjustment of her medicine and also will be  started on Chantix for smoking cessation.   MEDICATIONS:  1. OxyContin sustained release 40 mg q.i.d.  2. Roxicodone 15 mg 1-2 tablets p.o. t.i.d. p.r.n.  3. Ultram 50 mg 2 tablets t.i.d. p.r.n.  4. Xanax p.r.n.  5. Metoprolol daily.  6. Lidoderm patches p.r.n.  7. Flexeril 10 mg b.i.d. p.r.n.  8. Lasix approximately 2 times per week p.r.n.  9. Potassium chloride p.r.n.   REVIEW OF SYSTEMS:  Noncontributory.   PHYSICAL EXAMINATION:  GENERAL:  Middle-aged adult female in moderate  acute distress.  VITAL SIGNS:  In the office, blood pressure was 146/89 with pulse of  114, respiratory rate 18, and O2 saturation 99% on room air.  EXTREMITIES:  She ambulates with a single-point cane.  She has decreased  lumbar range of motion in all planes.   IMPRESSION:  1. Chronic low back pain, status post lumbar fusion L3-L4 in December      1998.  2. Lumbar spondylosis.  3. Reported cervical degenerative disk disease/herniated nucleus      pulposus.  4. Venous insufficiency of  the bilateral lower extremities.   In the office today, we did discuss adjusting her medicines and actually  increased her OxyContin from 40 mg q.i.d. to 60 mg t.i.d.  This will be  increased from 160 mg to 180 mg daily.  We will have her continue taking  her Roxicodone at 15 mg, maximum of 7 per day.  We have also gave her  prescriptions for Chantix, which she can start for smoking cessation.  We did obtain a urine drug screen on the patient in the office today  after she was able to hydrate.  We will plan on seeing the patient in  followup in this office in approximately 3 months' time with refills  prior to that appointment as necessary.           ______________________________  Ellwood Dense, M.D.     DC/MedQ  D:  06/27/2008 11:41:30  T:  06/28/2008 01:27:58  Job #:  [id number]

## 2011-01-19 NOTE — H&P (Signed)
NAME:  Jennifer Miles, Jennifer Miles             ACCOUNT NO.:  [id number]   MEDICAL RECORD NO.:  [id number]          PATIENT TYPE:  OBV   LOCATION:  1305                         FACILITY:  [facility]   PHYSICIAN:  Lonia Blood, M.D.      DATE OF BIRTH:  06-10-1961   DATE OF ADMISSION:  08/19/2008  DATE OF DISCHARGE:                              HISTORY & PHYSICAL   PRIMARY CARE PHYSICIAN:  Patient is unassigned.   CARDIOLOGIST:  Dr. Orpah Cobb.   NEUROSURGEON:  [facility].   PRESENTING COMPLAINT:  Nausea and vomiting.   HISTORY OF PRESENT ILLNESS:  The patient is a 50 year old female with  history of chronic pain on tons of narcotics after having a back surgery  back in 1998.  The patient apparently started having nausea and vomiting  for over 2 days.  It has progressed to the extent that she is unable to  keep anything down.  She denied any abdominal pain.  Denied any  hematemesis, no melena, no bright red blood per rectum.  The patient was  unable to keep her medications down at home so she decided to come to  the emergency room.  In the ER she was seen, evaluated and discharged.  The patient was still unable to keep any food down hence she is being  admitted for further management.   PAST MEDICAL HISTORY:  1. Chronic pain syndrome, she goes the pain clinic.  She is on tons of      medications for that.  2. History of atrial fibrillation with irregular heart rate, follows      up with Dr. Orpah Cobb.  3. History of chronic venous insufficiency and chronic venous stasis.  4. Tobacco abuse.  5. GERD.  6. Depression.   ALLERGIES:  She has NO KNOWN DRUG ALLERGIES.   MEDICATIONS:  1. OxyContin currently at 60 mg t.i.d., also OxyContin 15 mg tablet up      to seven times per day.  2. Ultram 50 mg p.r.n.  3. Dilaudid 2 mg as needed.  4. Cymbalta 30 mg t.i.d.  5. Lopressor 50 mg b.i.d.  6. Prevacid as needed.  7. Xanax 1 mg t.i.d.  8. Ambien 5 mg as needed at night.  9. Lasix  40 mg p.r.n.   SOCIAL HISTORY:  The patient lives in [place].  She smokes up to a  pack per day.  Denied any alcohol or IV drug use.   FAMILY HISTORY:  Noncontributory.   REVIEW OF SYSTEMS:  A 14-point review of systems is negative except per  HPI.   EXAMINATION:  Temperature 100.7, blood pressure 127/66, pulse 62,  respiratory rate 20, sats 100% room air.  GENERALLY:  She is awake, alert, oriented, in mild distress due to her  vomiting.  HEENT:  PERRL, EOMI.  NECK:  Supple, no JVD, no lymphadenopathy.  RESPIRATORY:  She has good air entry bilaterally.  No wheezes or rales.  CARDIOVASCULAR:  System shows S1 - S2, no murmur.  ABDOMEN:  Soft, full, nontender with positive bowel sounds.   LABS:  White count is 4.5, hemoglobin 15.8 with  platelet count of 253,  mild neutrophilia.  Sodium 140, potassium 4.3, chloride 104, CO2 24,  glucose 139, BUN 6, creatinine 0.72 and calcium 10.  PT/INR is within  normal.  Urinalysis showed an amber, cloudy urine with small bilirubin,  ketones more than 80, some proteinuria, small leukocyte esterase, WBCs  zero to 2, and mucus present.   ASSESSMENT:  Therefore is a 50 year old female with intractable nausea,  vomiting.  The differentials is very wide.  She does not appear to have  active urinary tract infection.  But this could be related to  medications, especially since she is on narcotics.  She has no abdominal  pain but could still have evidence of gastritis.  Could also be other  medications that we have not checked so far.   PLAN:  1. Intractable nausea, vomiting.  Will admit the patient and bowel      rest.  Keep her NPO with additives to her intravenous fluids using      combination of Zofran and Phenergan to control her nausea,      vomiting.  I will check urine drug screen.  I will repeat the      urinalysis also in the morning, rule out pyelo.  If it is positive      I will go ahead and treat her with antibiotics.  Will also  continue      with her narcotics to avoid any withdrawal symptoms.  2. Chronic pain.  Again patient will be NPO so I will give her some      intravenous Dilaudid while awaiting her oral intake to resume.      Once that resolves, will resume her home therapy prior to      discharging her.  3. History of irregular heartbeat.  Again, the patient has been on      beta blockers.  We will use p.r.n. intravenous beta blockers as      needed to control her heart rate.  4. Gastroesophageal reflux disease.  I will continue with proton pump      inhibitors intravenously.  5. Tobacco abuse.  I will put the patient on some nicotine patch while      she is in the hospital as needed.   Further treatment will depend on the patient's initial response and if  she does well will discharge her within 24 - 48 hours.      Lonia Blood, M.D.  Electronically Signed     LG/MEDQ  D:  08/20/2008  T:  08/20/2008  Job:  [id number]

## 2011-01-19 NOTE — Assessment & Plan Note (Signed)
The patient returns to the clinic today for follow-up evaluation. She  reports that she is doing fairly well with her OxyContin and her  Roxicodone medications.  She needs each of those refilled over the next  few days.  She is using her OxyContin 40 mg four times a day and her  Roxicodone approximately five tablets per day.   The patient did have two lumbar epidural steroid injections with Dr.  Eduard Clos and Dr. Venetia Maxon at their office.  She reports that the lumbar  epidural steroid injections gave her no relief whatsoever.  She does  report relief with a steroid injection in her right shoulder recently.   The patient is taking Lasix and potassium on an as needed basis,  generally two times per week.   MEDICATIONS:  1. OxyContin sustained release 40 mg q.i.d.  2. Ultram 50 mg two tablets t.i.d. p.r.n.  3. Klonopin p.r.n.  4. Metoprolol daily.  5. Lidoderm patches p.r.n.  6. Flexeril 10 mg b.i.d. p.r.n.  7. Roxicodone 15 mg one tablet five times per day p.r.n.  8. Lasix approximately two times per week p.r.n.  9. Potassium chloride two times per week p.r.n.   PHYSICAL EXAMINATION:  GENERAL:  Middle aged adult female in mild to  moderate acute discomfort.  VITAL SIGNS:  Blood pressure 140/80 with a pulse of 103, respiratory  rate 18, and O2 saturation 97% on room air.  NEUROLOGY:  She ambulates with a single point cane.  She has 4+/5  strength throughout the bilateral upper and lower extremities.  She  complains of pain in her lumbar spine with any lumbar range of motion.   IMPRESSION:  1. Chronic low back pain, status post lumbar fusion L3-4 in December      of 1998.  2. Lumbar spondylosis.  3. Reported cervical degenerative disk disease/herniated nucleus      pulposus.  4. Venous insufficiency of the lower extremities.   In the office today, we did refill the patient's Ultram along with her  Roxicodone and OxyContin sustained release.  We will plan on seeing her  in follow-up  in approximately 2-3 months time with refills prior to that  appointment as necessary.  She continues to get reasonable analgesic  effect from the combination of medication listed above.  Unfortunately,  her doses have steadily increased and she has built up tolerance.  We  will plan on seeing her in follow-up as noted above.           ______________________________  Ellwood Dense, M.D.     DC/MedQ  D:  11/08/2007 11:58:03  T:  11/08/2007 12:25:51  Job #:  [id number]

## 2011-01-19 NOTE — Assessment & Plan Note (Signed)
[name] returns to the clinic today for a follow-up evaluation. She  was recently hospitalized for three days because of lower extremity  swelling.  She apparently was diagnosed with venous insufficiency of her  lower extremities and was placed on support hose.  She reports that  Doppler studies were done and showed no sign of blood clots.  She also  reports that she was found to have no blockage of the blood vessels of  her lower pelvis.   The patient reports that she has a sufficient supply of Lidoderm and  Flexeril but does need a refill on the Ultram.  She is recently refilled  on her OxyContin 40 mg twice daily, and that has been helping her  better, compared to the OxyContin 20 mg three times a day.  The change  is from 60 mg up to 80 mg a day, and that seems to be helping overall.   MEDICATIONS:  1. OxyContin sustained release 40 mg b.i.d.  2. Ultram 50 mg 2 tablets t.i.d. p.r.n.  3. Xanax p.r.n.  4. Metoprolol daily.  5. Lidoderm patches p.r.n.  6. Flexeril p.r.n.   REVIEW OF SYSTEMS:  Positive for limb swelling.   PHYSICAL EXAMINATION:  GENERAL:  A well-appearing, fit adult female in  moderate acute discomfort.  VITAL SIGNS:  Not obtained in the office today.  NEUROMUSCULAR:  She ambulates with a single-point cane with an antalgic  gait.  She has -4/5 strength in her bilateral upper and lower  extremities.   IMPRESSION:  1. Chronic low back pain, status post lumbar fusion, L3-4, in      December, 1998.  2. Lumbar spondylosis.  3. Venous insufficiency with lower extremity swelling.   In the office today, we did refill the patient's Ultram.  She has a  sufficient supply of Lidoderm along with Flexeril and OxyContin at this  point.  She is encouraged to remain as active as possible for an hour at  a time, and she has to rest and elevate her legs in addition to using  the supportive hose.  I have also asked her to start taking an aspirin  81 mg 1 tablet daily, as she  also has a history of an irregular heart  rate in addition to her venous insufficiency.  We will plan on seeing  her in followup in approximately 3-4 months time with refills prior to  that appointment as necessary.           ______________________________  Ellwood Dense, M.D.     DC/MedQ  D:  03/15/2007 11:51:19  T:  03/15/2007 20:56:19  Job #:  [id number]

## 2011-01-19 NOTE — Discharge Summary (Signed)
NAME:  Jennifer Miles, Jennifer Miles             ACCOUNT NO.:  [id number]   MEDICAL RECORD NO.:  [id number]          PATIENT TYPE:  OBV   LOCATION:                               FACILITY[facility]  [facility]   PHYSICIAN:  Hillery Aldo, M.D.   DATE OF BIRTH:  1961-05-29   DATE OF ADMISSION:  08/20/2008  DATE OF DISCHARGE:  08/22/2008                               DISCHARGE SUMMARY   PRIMARY CARE PHYSICIAN:  None.   CARDIOLOGIST:  Dr. Algie Coffer.   PAIN CONTROL PHYSICIAN:  Dr. Thomasena Edis.   DISCHARGE DIAGNOSES:  1. Intractable nausea and vomiting likely due to narcotic induced      gastroparesis and decreased bowel motility  2. Chronic narcotic use/dependency.  3. Chronic pain syndrome.  4. History of paroxysmal atrial fibrillation.  5. Tobacco abuse.  6. Gastroesophageal reflux disease.  7. Depression.  8. Hypokalemia   DISCHARGE MEDICATIONS:  1. OxyContin 60 mg q.12 hours (note, decreased from q.8 hours).  2. OxyIR 15 mg q.6 hours p.r.n. (note, decreased from up to seven      times per day)  3. Reglan 10 mg q.a.c. and bedtime.  4. Cymbalta 30 mg daily  5. Lopressor 50 mg q.12 hours.  6. Prevacid 20 mg daily.  7. Xanax 1 mg t.i.d. p.r.n.  8. Anemia of 5 mg q.h.s. p.r.n.   CONSULTATIONS:  1. Dr. Algie Coffer of cardiology.   PROCEDURES AND DIAGNOSTIC STUDIES:  1. Chest x-ray on August 19, 2008 showed no acute disease.  2. Abdominal ultrasound on August 21, 2008 showed dilatation of the      common bile duct, no LST elevation.  No acute findings.  3.  The      patient is status post cholecystectomy.   DISCHARGE LABORATORY VALUES:  Sodium was 140, potassium 3.7, chloride  108, bicarb 27, BUN 3, creatinine 0.61, glucose 108.  White blood cell  count was 8.6, hemoglobin 13.3, hematocrit 40.3, platelets 219.   HOSPITAL COURSE BY PROBLEM:  1. Intractable nausea and vomiting:  The patient was admitted and kept      NPO until her symptoms abated.  She was empirically put on Reglan      and attempts to  get a gastric emptying study were unsuccessful due      to her symptoms.  The patient did have an abdominal ultrasound      which was unrevealing and her symptoms resolved with judicious use      of IV Dilaudid for pain control.  Her oral analgesics were      completely stopped.  At this point, the patient has been symptom      free for 48 hours and she is moving her bowels.  The patient has      been encouraged to decrease her use of narcotics and to follow up      with Dr. Thomasena Edis for consideration of alternative methods of pain      control that do not involved narcotics.  2. Chronic narcotic use/dependency:  Again, the patient has been      encouraged to decrease her use and follow up with  pain clinic.  3. History of paroxysmal atrial fibrillation:  The patient has been      maintaining normal sinus rhythm on metoprolol therapy.  4. Tobacco abuse:  The patient was counseled on cessation and provided      with a nicotine patch while in the hospital.  5. Gastroesophageal reflux disease:  The patient was maintained on      proton pump inhibitor therapy.  6. Depression:  The patient's mood has been stable and her affect      bright.  She continues on Cymbalta therapy.  7. Hypokalemia:  The patient was appropriately repleted.  Her      discharge potassium is 3.7.   DISPOSITION:  The patient is medically stable for discharge will be  discharged home.  She is encouraged to follow up with the pain clinic in  1 - 2 weeks.   Time spent coordinating care for discharge and discharge instructions  include 35 minutes.      Hillery Aldo, M.D.  Electronically Signed     CR/MEDQ  D:  08/22/2008  T:  08/22/2008  Job:  [id number]   cc:   Ricki Rodriguez, M.D.  Fax: [phone]   Dr. Thomasena Edis

## 2011-01-19 NOTE — Assessment & Plan Note (Signed)
Jennifer Miles returns to the clinic today ahead of her scheduled  appointment secondary to being out of medications and apparently going  through withdrawal.   The patient was seen by Dr. Venetia Maxon, as indicated by a note that I was  sent and that office visit was July 28, 2007.  At that time, it was  found that she had been seen for shoulder and arm pain, and was found to  have reported degenerative disk disease in the cervical region with pain  down her arm.  Dr. Fredrich Birks note indicates that they would be following  up with her with possible splinting and possible shoulder injections.  The note did not mention any increase in pain medicines, and the patient  herself reports that they were aware that we were following her for  pain, and they definitely did not want to interfere with that.  There is  no mention about increasing pain medicines.  The patient herself reports  that she was told to increase her OxyContin to 1 tablet 4 times a day  and increase her Roxicodone up to 5 times per day.  She did do that  without notifying this office, and, of course, ran out of prescriptions  ahead of time.  She apparently has been experiencing nausea and severe  discomfort over the past 3 to 4 days, and was seen in the emergency room  and diagnosed with acute narcotic withdrawal.  She comes in today ahead  of her scheduled appointment stating that she has used up her  medications that were previously prescribed to her, prescriptions  written July 28, 2007.   MEDICATIONS:  1. OxyContin sustained release 40 mg t.i.d. now, recently used q.i.d.  2. Ultram 50 mg 2 tablets t.i.d. p.r.n.  3. Klonopin p.r.n.  4. Metoprolol daily.  5. Lidoderm patches p.r.n.  6. Flexeril 10 mg b.i.d. p.r.n.  7. Roxicodone 15 mg q.i.d. p.r.n. now 5 times per day p.r.n.   REVIEW OF SYSTEMS:  Noncontributory.   PHYSICAL EXAM:  Ill-appearing adult female lying on the exam table.  Blood pressure is 139/67 with a pulse  rate of 67, respiratory rate 18,  and O2 saturation 100% on room air.  She ambulates with a single-point cane, has 4-/5 strength throughout.  She complains of severe nausea and feeling poorly overall.   IMPRESSION:  1. Chronic low back pain, status post lumbar fusion L3-4, December      1998.  2. Lumbar spondylosis.  3. Reported cervical degenerative disk disease/herniated nucleus      pulposis.  4. Venous insufficiency of the lower extremities.  5. Lumbar spondylosis.   In the office today, we did refill the patient's Roxicodone and  OxyContin sustained release.  She is using the sustained release 4 times  a day now and the Roxicodone up to 5 times per day.  I have warned her  against adjusting medications, as I am still not sure if the medication  adjustment was at the direction of Dr. Venetia Maxon or on her own.  There is no  mention in the office note from Dr. Venetia Maxon about increasing the pain  medicines, and she herself reports that they were very hesitant to do  anything with the pain medicines, as she was followed through this  office.  She needs to use all of her prescriptions as written and,  certainly, follow them so that she does not experience side  effects such as withdrawal in the future.  We will plan on seeing  her in  followup as previously scheduled, March 2009.           ______________________________  Ellwood Dense, M.D.     DC/MedQ  D:  08/23/2007 11:36:33  T:  08/23/2007 16:40:35  Job #:  [id number]
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
## 2011-01-22 NOTE — Assessment & Plan Note (Signed)
Jennifer Miles returns to the clinic today for followup evaluation.  She reports  that overall she has been doing fairly well.  She has noticed some increased  pain in her left leg especially through the night but not too much through  the day.  She has been using her Vicodin approximately 3-4 tablets a day and  that gets her good relief combined with the Ultram 50 mg two tablets three  times a day and Flexeril 10 mg at bedtime.  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   MEDICATIONS:  1.  Vicodin 10/650, one tablet t.i.d. to q.i.d. p.r.n.  2.  Amitriptyline 50 mg q.h.s. p.r.n.  3.  Flexeril 10 mg every day p.r.n.  4.  Ultram 50 mg two tablets p.o. t.i.d. p.r.n.   PHYSICAL EXAMINATION:  GENERAL:  A well-appearing, fit adult female, in mild  acute discomfort.  VITAL SIGNS:  blood pressure 129/75 with a pulse of 95, respiratory rate 16,  and O2 saturation 98% on room air.  MUSCULOSKELETAL:  She has 5- over 5 strength throughout the bilateral lower  extremities.  Bulk and tone were normal and reflexes were 2+ and  symmetrical.  She ambulates without any assistive device but complains of  stiffness after prolonged sitting.   IMPRESSION:  1.  Chronic low back pain, status post lumbar fusion L3-L5, December 1998.  2.  Lumbar spondylosis (721.3).   PLAN:  1.  No refill of medication is necessary in the office today.  We have told      her that she could increase her Flexeril to 10 mg two tablets p.o.      q.h.s. from one tablet that she is taking at present.  She will call in      for refills of the Vicodin, Ultram, and Flexeril, along with  amitriptyline as needed.  Each of those has either a sufficient supply or  refills available at this point.  1.  We will plan on seeing her in followup in approximately three months'      time.       DC/MedQ  D:  07/15/2004 13:07:22  T:  07/15/2004 16:40:47  Job #:   [id number]

## 2011-01-22 NOTE — Assessment & Plan Note (Signed)
Jennifer Miles returns to the clinic today for followup evaluation.  She reports  that she has been experiencing more pain on a periodic basis, especially  involving her left leg.  She reports that occasionally she is unable to  stand on it for hours at a time but then the pain resolves, and she is able  to get around at a reasonable level.  She continues to get good relief from  a combination of her oxycodone, Flexeril, and Ultram.   The patient has developed arrhythmia and was stopped in terms of her  amitriptyline and started on Metoprolol.  She has also been started on  Cymbalta at 60 mg every day by her primary care physician.  Due to the fact  that the amitriptyline was stopped, the patient was experiencing some  insomnia.  She was given a prescription for Xanax, but she reports that only  helped her fall asleep but did not keep her asleep.  She tried [place] but  that medication was too strong.   MEDICATIONS:  1.  Oxycodone 5 mg, two tablets p.o. t.i.d. p.r.n.  2.  Flexeril 10 mg every day p.r.n.  3.  Ultram 50 mg, two tablets t.i.d. p.r.n.  4.  Cymbalta 60 mg every day.  5.  Metoprolol every day.   REVIEW OF SYSTEMS:  Noncontributory.   PHYSICAL EXAMINATION:  GENERAL:  A reasonably well-appearing, fit, adult  female in mild to moderate acute discomfort.  VITAL SIGNS:  Blood pressure 142/78 with a pulse of 91, respiratory rate 16,  and O2 saturation 98% on room air.  MUSCULOSKELETAL:  She is 4+/5 strength throughout the right lower extremity  and 4-/5 strength throughout the left lower extremity.  Bilateral upper  extremity strength was 5-/5.   IMPRESSION:  1.  Chronic low back pain, status post lumbar fusion L3-L4, December 1998.  2.  Lumbar spondylosis.   1.  In the office today, no refill on medication is necessary.  2.  I did suggest to her that she try cutting the Flexeril 10 mg tablet in      half and taking 5 mg to help her with sleep.  That has had a side effect  of making her sleepy and she could use that for her insomnia at night.  3.  We will plan on seeing the patient in followup in approximately four      months' time with refill medication prior to that point as necessary.           ______________________________  Ellwood Dense, M.D.     DC/MedQ  D:  10/13/2005 12:37:16  T:  10/13/2005 14:07:46  Job #:  [id number]

## 2011-01-22 NOTE — Assessment & Plan Note (Signed)
Ms. Uplinger returns to the clinic today for followup evaluation. We last saw  her February 10, 2005. At that time, she was using hydrocodone. We did have to  change her over to Oxycodone approximately late July. She has been using  that and getting better relief with that. She uses 5 mg 2 tablets anywhere  from 2-3 times per day. She reports that she has a fair amount of pain and  would like to be able to use it occasionally more frequent if able. She  reports that she has some radiating pain down the back of her left leg into  her ankle on the left side. Recent MRI scan of her back done had shown  satisfactory appearance of the L3-4 effusion. There were degenerative  changes present at L5-S1 but no nerve root encroachment. There was also mild  bulging at L2-3 and L4-5. I doubt that a repeat MRI scan would be beneficial  at this time.   MEDICATIONS:  1.  Oxycodone 5 mg 2 tablets p.o. t.i.d. p.r.n.  2.  Amitriptyline 50 mg q.h.s. p.r.n.  3.  Flexeril 10 mg daily p.r.n.  4.  Ultram 50 mg 2 tablets t.i.d. p.r.n.   REVIEW OF SYMPTOMS:  Positive for weight gain and urinary retention.   PHYSICAL EXAMINATION:  GENERAL:  A reasonably well appearing adult female in  mild acute discomfort.  VITAL SIGNS:  Blood pressure 130/61 with a pulse of 93, respiratory rate 16  and O2 saturation 97% on room air.   She has 5-/5 strength in throughout the bilateral upper and lower  extremities. She ambulates without any assistive device. Reflexes were 2+  and symmetric and sensation was intact to light touch throughout.   IMPRESSION:  1.  Chronic low back pain, status post lumbar fusion L3-4 in December 1998.  2.  Lumbar spondylosis.   In the office today, we did allow her some flexibility with her oxycodone. I  have written a prescription for 5 mg to be used 1-2 tablets p.o. b.i.d. to  q.i.d. That is anywhere from 4-8 tablets per day. She is to average  approximately 6 per day. We also refilled her Ultram,  amitriptyline and  Flexeril in the office today. Each of those last three prescriptions has  refills. We will plan on seeing her in followup in approximately four months  time with refill of medication prior to that appointment if necessary.           ______________________________  Ellwood Dense, M.D.     DC/MedQ  D:  06/10/2005 11:57:07  T:  06/10/2005 12:14:34  Job #:  [id number]

## 2011-01-22 NOTE — Assessment & Plan Note (Signed)
FOLLOW UP VISIT NOTE   Mrs. Jennifer Miles returns to clinic today for follow up evaluation. She  reports that she is having much worse back pain along with an unusual  sensation of the left leg which is in the posterior lateral hip down to  her left ankle. She has undergone MRI scan of her lumbar spine on April 23, 2006 which showed stable fusion at L3/4 where a laminectomy had been  done. At L4/5 on the left there was a small protrusion into the left  foramen causing mild left foramina stenosis. The right foramen was  widely patent. There  are minimal changes at the L5/S1 joint. The  patient reports that she is not getting much benefit from her pain  medicine. She is using Oxycodone sustained release 20 mg q.12 hours. She  continues to take the Roxicodone 15 mg approximately q.i.d.   MEDICATIONS:  1. Roxicodone 15 mg 1 tablet q.i.d.  2. Flexeril 10 mg.  3. Ultram 50 mg 2 tablets t.i.d. p.r.n.  4. Xanax p.r.n.  5. Oxycodone extended release 20 mg q.12 hours.  6. Metoprolol q.d.   REVIEW OF SYSTEMS:  Positive for limb swelling.   PHYSICAL EXAMINATION:  Ill-appearing adult female in moderate, acute  discomfort involving her left leg. Blood pressure 117/67. Pulse 82.  Respiratory rate 18. O2 saturation 97% on room air. She has 4+/5  __________ to the bilateral upper extremities. Left lower extremity  strength was 4-/5. She ambulates with an antalgic gait on the left side.   IMPRESSION:  1. Chronic low back pain, status post lumbar effusion L3/09 August 1997.  2. Lumbar spondylosis.   In the office today, we did adjust her pain medicines to increase her  OxyContin Sustained Release to 20 mg t.i.d. We refilled her Roxicodone  and maintained that at 4 times a day as needed. No adjustments were made  in her Flexeril or Ultram medication. We did attempt to set up an MRI  scan and will get, hopefully, approval through her insurance to evaluate  for any worsening foraminal stenosis on  the left side at L4/5 where she  had prior changes August of last year. Hopefully we can get approval for  that and get an MRI scan done within the next few days. In the meantime,  she will continue on the above noted medications with the adjustments as  noted.           ______________________________  Ellwood Dense, M.D.     DC/MedQ  D:  10/24/2006 16:17:16  T:  10/25/2006 06:10:24  Job #:  [id number]

## 2011-01-22 NOTE — Assessment & Plan Note (Signed)
Ms. Laffey returns to clinic today for followup evaluation.  The patient  still reports that she is not getting adequate pain relief from her  OxyContin used 20 mg 3 times a day, along with p.r.n. Roxicodone.  She  also takes Ultram 2 tablets 3 times a day.  She is requesting refills on  medicines along with an adjustment of her controlled release medicine.  She also feels that she needs to restart the Flexeril medication, and  she would like to have samples of Lidoderm patches which she has  obtained from a relative.  She feels that those would give her better  benefit if she were to use those in addition to her other medicines.   MEDICATIONS:  1. Roxicodone 15 mg 1 tablet q.i.d.  2. Ultram 50 mg 2 tablets t.i.d. p.r.n.  3. Xanax p.r.n.  4. Oxycodone Sustained Release 20 mg q.8 h.  5. Metoprolol daily.   REVIEW OF SYSTEMS:  Noncontributory.   PHYSICAL EXAMINATION:  A reasonably well-appearing adult female in  moderate acute discomfort.  Blood pressure 118/64 with pulse of 95, respiratory rate 16, and O2  saturation 97% on room air.  She has 4-/5 strength in the bilateral upper and lower extremities.  She  ambulates with an antalgic gait on the left side.   IMPRESSION:  1. Chronic low back pain, status post lumbar fusion, L3-L4, December,      1998.  2. Lumbar spondylosis.   In the office today, we did review the MRI scan dated October 27, 2006  which showed satisfactory fusion at L3-L4 with small disk protrusion at  L4-L5, which was slightly worse compared to the study in 1998.  There  was significant degenerative disk disease at L5-S1, but there was no  spinal stenosis noted.   In the office today, we did refill the patient's Ultram and restarted  her on Flexeril.  We continued the Roxicodone at 15 mg 1 tablet q.i.d.  We adjusted her sustained release Oxycodone to 40 mg twice a day instead  of 20 mg 3 times per day.  That should be a slight increase which should  give her  better benefit.  We also gave her samples of the Lidoderm  patches in the office today.  Will plan on seeing her in followup in  approximately 3 months' time with refill of medication prior to that  appointment if necessary.           ______________________________  Ellwood Dense, M.D.     DC/MedQ  D:  12/16/2006 16:07:05  T:  12/16/2006 16:43:43  Job #:  [id number]

## 2011-01-22 NOTE — Assessment & Plan Note (Signed)
Jennifer Miles returns to the clinic today for followup evaluation.  She reports  that the pain medicines have been working reasonably well for her, although  she has had an increase in her oxycodone from some time 6-8 tablets a day.  We have actually increased her to t.i.d. from b.i.d. back in November 2006.  She has been at a t.i.d. dose for about 5-6 months now.  She feels that when  she takes it 4 times a day, she gets relief and she would like to have that  prescription increased in terms of the amount.  She does use her Flexeril on  an as needed basis and usually uses her Ultram 2 tablets 3 times per day  every day.   MEDICATIONS:  1.  Oxycodone 5 mg, 1-2 tablet p.o. t.i.d. p.r.n. (occasionally up to 8 per      day).  2.  Flexeril 10 mg every day p.r.n.  3.  Ultram 50 mg, 2 tablets t.i.d. p.r.n.  4.  Cymbalta 60 mg every day.  5.  Xanax p.r.n.  6.  Metoprolol every day.   REVIEW OF SYSTEMS:  Noncontributory.   PHYSICAL EXAMINATION:  GENERAL:  A well-appearing, fit, adult female who  ambulates with a single-point cane.  VITAL SIGNS:  Blood pressure is 133/74 with a pulse of 77, respiratory rate  16, and O2 saturation 99% on room air.  MUSCULOSKELETAL:  She has 4+/5 strength throughout the right lower extremity  and 4-/5 strength throughout the left lower extremity.  Bilateral upper  extremity strength was 5-/5.   IMPRESSION:  1.  Chronic low back pain, status post lumbar fusion, L3-L4, December 1998.  2.  Lumbar spondylosis.   1.  In the office today, we refilled her oxycodone and allowed her to use 1-      2 tablets p.o. q.i.d. p.r.n., a total of 240 of the 5 mg strength.  2.  No refill on the Flexeril or Ultram is necessary in the office today.  3.  We will plan on seeing the patient in followup in this office in      approximately 4 months' time with refills prior to that appointment as      necessary.           ______________________________  Ellwood Dense,  M.D.     DC/MedQ  D:  02/11/2006 09:50:32  T:  02/11/2006 16:48:35  Job #:  [id number]

## 2011-01-22 NOTE — Assessment & Plan Note (Signed)
Miss Finger returns to clinic today for follow up evaluation. She  reports that overall she is getting fair relief from her  Roxicodone use  p.r.n. along with her oxycodone sustained release and her Flexeril and  Ultram. She reports that she has some limitations in her abilities to  walk and do chores around the house. She tries to be as active as  possible. She does need refills on the Roxicodone and oxycodone extended  release in the office today.   MEDICATIONS:  1. Roxicodone 15 mg 1 tablet t.i.d.  2. Flexeril 10 mg.  3. Ultram 50 mg 2 tablets t.i.d. p.r.n.  4. Xanax p.r.n.  5. Oxycodone extended release 20 mg q. 12 hours.  6. Metoprolol daily.   REVIEW OF SYSTEMS:  Non contributory.   PHYSICAL EXAMINATION:  Reasonably well-appearing middle aged adult  female in mild acute discomfort. Blood pressure is 110/64 with a pulse  of 80, respiratory rate 16, and O2 saturation at 97% on room air. She 4  to 4+/5 strength throughout the bilateral upper and lower extremities.  She ambulates without any assisted device.   IMPRESSION:  1. Chronic low back pain, status post lumbar fusion L3-09 August 1997.  2. Lumbar spondylosis.  3. In the office today we did refill the patient's oxycodone extended      release along with her Roxicodone 15 mg used 3 times per day as      needed. We will plan on the seeing the patient in follow up in      approximately 3 to 4 months time. I have encouraged her to be as      active as possible with the pain relief that she does get from the      medicines even though it is not complete. We will plan on seeing      the patient in follow up in approximately 3 to 4 months time.           ______________________________  Ellwood Dense, M.D.     DC/MedQ  D:  08/17/2006 14:43:19  T:  08/17/2006 17:53:58  Job #:  [id number]

## 2011-01-22 NOTE — Assessment & Plan Note (Signed)
Jennifer Miles returns to the clinic today for followup evaluation.  Overall she  reports that she is doing well.  She continues to modify her activity as  best possible.  She does do some cleaning service, but only one day a week.  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  She continues to use her pain  medicines as noted below with good relief.   MEDICATIONS:  1. Ultram 50 mg one to two tablets p.o. t.i.d. p.r.n.  2. Vicodin 7.5/750 mg one tablet p.o. q.i.d. p.r.n.  3. Celebrex 100 mg b.i.d.  4. Amitriptyline 50 mg q.h.s.  5. Robaxin 500 mg one tablet b.i.d. p.r.n.   PHYSICAL EXAMINATION:  A well-appearing adult female with a blood pressure  of 149/83 with a pulse of 109 and an O2 saturation of 96% on room air.  She  has strength of 5-/5 throughout the lower extremities.  Bulk and tone were  normal.  Reflexes were 2+ and symmetrical.   IMPRESSION:  1. Chronic low back pain, status post lumbar fusion of L3-4 in December of     1998.  2. Lumbar spondylosis (721.3).   In the office today the patient reports that she needs a refill on each  medication and those were supplied to her.  She is doing well at this time  and is encouraged to continue her activity as tolerated.  I will plan on  seeing her in followup in approximately four months' time.      Ellwood Dense, M.D.   DC/MedQ  D:  09/19/2003 11:43:25  T:  09/19/2003 12:19:55  Job #:  [id number]

## 2011-01-22 NOTE — Assessment & Plan Note (Signed)
MEDICAL RECORD NUMBER:  [id number].   Jennifer Miles returns to clinic today for followup evaluation. She reports that  overall she is doing well. She has had some weight gain over the past three  or four months, total is in the 20 to 30 pound range. She reports that she  has had some heart palpitations recently and was seen by cardiologist and  started on Toprol-XL 50 mg 1/2 tablet q.d. She reports that he blood  pressure has been occasionally normal and occasionally been elevated. She  did undergo a recent treadmill stress rest and reports that all was okay  along with an EKG.   The patient reports that she gets relief using Vicodin, Flexeril, Ultram  along with her amitriptyline.   MEDICATIONS:  1.  Vicodin 6/600 one tablet t.i.d. p.r.n.  2.  Amitriptyline 50 mg q.h.s. p.r.n.  3.  Flexeril 10 mg q.d. p.r.n.  4.  Ultram 50 mg 2 tablets t.i.d. p.r.n.   PHYSICAL EXAMINATION:  Well-appearing adult female in mild acute discomfort.  Blood pressure 147/93 with a pulse of 92, respiratory rate 20, and O2  saturation 100% on room air. She has 5-/5 strength throughout the bilateral  upper and lower extremities. Bulk and tone were normal, and reflexes were 2+  and symmetrical. Sensation was intact to light touch throughout. She  ambulates without any assistive device.   IMPRESSION:  1.  Chronic low back pain, status post lumbar fusion, L3-4, December 1998.  2.  Lumbar spondylosis (721.3).   In the office today, I did refill her Flexeril, hydrocodone, and  amitriptyline. We recently refilled her tramadol as of October 07, 2004. We  will plan on seeing in her followup in approximately four months' time. She  reports that she is interested in going back to [employer] where she has  been successful in weight loss in the past.   We will plan on seeing her in followup in approximately four months' time.      DC/MedQ  D:  10/16/2004 12:11:41  T:  10/16/2004 16:24:57  Job #:  [id number]

## 2011-01-22 NOTE — Assessment & Plan Note (Signed)
MEDICAL RECORD NUMBER:  [id number].   Ms. Echeverry returns to clinic today for followup evaluation. She does report  that her pain has been worse this past summer with increased humidity. She  does report that she tries to walk around the home outside as much as  possible and generally is able to walk approximately a quarter of a mile at  times before having to rest. She does report occasional spasms in her low  back that causes her right leg to give out. She does get relief with the  combination of the medicines noted below. She did wean from the Celebrex  medication and reports that she has not felt any worse. She also had tried  taking herself off the Vicodin and Ultram medication but had noted that her  pain was substantially worse and returned to taking those medications.   MEDICATIONS:  1. Vicodin 10/660 one tablet p.o. t.i.d. to q.i.d. p.r.n.  2. Amitriptyline 50 mg q.h.s. p.r.n.  3. Flexeril 10 mg q.d. p.r.n.  4. Ultram 50 mg two tablets p.o. t.i.d. p.r.n.   PHYSICAL EXAMINATION:  Well appearing adult female in mild acute discomfort.  Blood pressure was 143/64 with pulse of 89, respiratory rate 20, and O2  saturation 100% on room air. She is 5/5 strength about the bilateral upper  and lower extremities. Bulk and tone were normal. Reflexes were 2+ and  symmetrical. She ambulates without any assistive device.   IMPRESSION:  1. Chronic low back pain, status post lumbar fusion, L3-5, December 1998.  2. Lumbar spondylosis (721.3).   In the clinic today, I did refill her hydrocodone, Ultram, and amitriptyline  medications. She has sufficient Flexeril at this point. We will plan on  seeing her in follow up in approximately three months' time. She is off the  Celebrex medication and reports no substantial increase in pain off that  medication.      Ellwood Dense, M.D.   DC/MedQ  D:  04/16/2004 10:52:11  T:  04/16/2004 15:26:47  Job #:  [id number]

## 2011-01-22 NOTE — Assessment & Plan Note (Signed)
Jennifer Miles returns to clinic today for followup evaluation.  She reports  that she has not been feeling too well lately.  She has been using her  hydrocodone 10/60 one tablet t.i.d.  She also uses Ultram 50 mg 2 tablets  p.o. t.i.d.  Complains of sharp pain occasionally in the mid line, and she  says this is secondary to muscle spasm which I have reported to her as  likely.  She is using Flexeril 10 mg daily and gets some relief.  She  reports that she walks approximately 10 minutes daily.  She would like to be  considered for an adjustment in her pain medicine as possible for improved  pain control.   MEDICATIONS:  1.  Vicodin 10/660 one tablet t.i.d. p.r.n.  2.  Amitriptyline 50 mg q.h.s. p.r.n.  3.  Flexeril 10 mg daily p.r.n.  4.  Ultram 50 mg 2 tablets t.i.d. p.r.n.   REVIEW OF SYSTEMS:  Noncontributory.   PHYSICAL EXAMINATION:  GENERAL:  Well-appearing adult female in mild acute  discomfort.  VITAL SIGNS: Blood pressure 153/63 with a pulse of 91, respiratory rate 16,  O2 saturation 97% on room air.  SIGNIFICANT PHYSICAL FINDINGS:  She has basically 5-/5 strength throughout  the bilateral upper and lower extremities.  She ambulates without any  assistive device.  Reflexes are 2+ and symmetrical. Sensation was intact to  light touch.   IMPRESSION:  1.  Chronic low back pain, status post lumbar fusion L3-09 August 1997.  2.  Lumbar spondylosis.   In the office today, we did increase hydrocodone to 10/660 one tablet p.o.  four times a day p.r.n.; a total of 120 were prescribed to allow her to use  it four times a day as necessary.   We also refilled her amitriptyline 50 mg 1 tablet p.o. q.h.s. with 2  refills.   We will plan on seeing her in followup in approximately four months' time  with refills prior to that appointment as necessary.      DC/MedQ  D:  02/10/2005 13:02:46  T:  02/10/2005 13:43:42  Job #:  [id number]

## 2011-01-22 NOTE — Assessment & Plan Note (Signed)
Jennifer Miles returns to clinic today for follow-up evaluation.  She has been  using the Roxicodone 15 mg 1 tablet approximately 4 times and occasionally 5  times per day.  She reports each tablet gives her approximately 3 hours of  relief.  She also uses her Ultram and Flexeril medications as noted below.  She wonders about having something that she could use on a regular basis to  give her pain relief apart from the Roxicodone medication.  She has not been  on any controlled-release medicine in the past.  We did discuss oxycodone  sustained release, along with Duragesic patch.  She feels that she would be  better controlled on the pill, and she is more used to that at the present  time.  She wants to have the Duragesic available in the future if she does  not get relief from the sustained-release pills.   MEDICATIONS:  1. Roxicodone 15 mg 1 tablet 4 times per day p.r.n.  2. Flexeril 10 mg daily p.r.n.  3. Ultram 50 mg 2 tablets t.i.d. p.r.n.  4. Cymbalta 60 mg __________.  5. Xanax p.r.n.  6. Metoprolol daily.   REVIEW OF SYSTEMS:  Positive for limb swelling.   PHYSICAL EXAMINATION:  GENERAL:  Well-appearing middle-aged adult female in  mild-to-no acute discomfort.  VITAL SIGNS:  Blood pressure 138/73, with pulse of 72, respiratory rate 16,  and O2 saturation 100% on room air.  NEUROLOGIC:  She has 4/5 strength throughout the bilateral upper and lower  extremities.  Bulk and tone were normal, and reflexes were 2+ and  symmetrical.  Sensation was intact to light touch throughout.   IMPRESSION:  1. Chronic low back pain, status post lumbar fusion L3-09 August 1997.  2. Lumbar spondylosis.   In the office today, we did refill the patient's Ultram and Flexeril  medications.  I did cut down her Roxicodone to 15 mg 1 tablet t.i.d. p.r.n.,  down from 4 times per day p.r.n.  We also started her on oxycodone sustained  release at 20 mg 1 tablet q.12 h., a total of 60 were prescribed.  Hopefully, we can wean away the Roxicodone medication as much as possible  and use time-released medicine for her.  Time will tell if she gains benefit  from this regimen.  We will plan on seeing her in followup in approximately  1-2 month's time.           ______________________________  Ellwood Dense, M.D.     DC/MedQ  D:  06/09/2006 11:04:37  T:  06/10/2006 15:00:30  Job #:  [id number]

## 2011-01-22 NOTE — Assessment & Plan Note (Signed)
Jennifer Miles returns to the clinic today for followup evaluation ahead of her  scheduled appointment.  She reports that over the past two to three weeks  she has had substantially increased back pain.  She reports that she drove  up to [place] to see her family, and that trip went okay.  She then  returned home with her husband doing most of the return driving.  She  reports that once she got home she had gradually increasing pain that has  become extremely severe over the past few weeks.  She has had two emergency  room visits recently.  No x-rays were obtained, but they did give her a  Dilaudid injection which she reports gave her relief.  They then gave her 2  mg of Dilaudid which she was allowed to take every 6 hours.  She reports  that she got no lasting relief from the Dilaudid and also feels that she  experienced heart flutters.  She has resumed the oxycodone 15 mg and has  been using it 3 to 4 times per day.  She feels that she gets the best relief  from that medication.   The patient has undergone a lumbar MRI scan with and without contrast March 11, 2005.  That study showed no significant new abnormalities with persistent  degenerative disk disease.   The patient reports that she is hurting, especially in her mid-back with  pain into her left leg.  She feels both of those pains are worse than  normal.   MEDICATIONS:  1. Oxycodone 15 mg 1 tablet t.i.d. p.r.n.  2. Flexeril 10 mg q. day p.r.n.  3. Ultram 50 mg 2 tablets t.i.d.  4. Cymbalta 60 mg q. day.  5. Xanax p.r.n.  6. Metoprolol q. day.   REVIEW OF SYSTEMS:  Noncontributory.   PHYSICAL EXAMINATION:  GENERAL:  Middle-aged adult female in moderate acute  discomfort.  She ambulates with a single-point cane with a severely antalgic  gait on the left side.  VITAL SIGNS:  Blood pressure is 145/76 with pulse of 99, respiratory rate 16  and O2 saturation 98% on room air.  EXTREMITIES:  She has 4/5 strength throughout the  bilateral upper  extremities and 4-/5 strength throughout the left lower extremity.  Sensation was intact to light touch.   IMPRESSION:  1. Chronic low back pain, status post lumbar fusion L3-09 August 1997.  2. Lumbar spondylosis.   In the office today, we did have the patient discontinue her Dilaudid.  In  place of that we have allowed her to take the oxycodone 15 mg 1 tablet  q.i.d. p.r.n., refill 120 tablets was given to her in the office today.  We  have also set her up for an MRI scan of her lumbar spine with and without  contrast to be compared to the March 11, 2005 study to rule out worsening  spondylosis/degenerative disk disease/herniated nucleus pulposus.  We will  be in contact with her regarding the results of that scan.  She will  continue to take the oxycodone as noted above, and we will plan on seeing  her in followup in October, unless new abnormalities shows up on the MRI  scan.           ______________________________  Ellwood Dense, M.D.    DC/MedQ  D:  04/22/2006 13:27:52  T:  04/22/2006 14:09:34  Job #:  [id number]

## 2011-06-01 LAB — BASIC METABOLIC PANEL
BUN: 11
CO2: 25
Calcium: 10
GFR calc non Af Amer: >60
Glucose, Bld: 106 — ABNORMAL HIGH

## 2011-06-01 LAB — DIFFERENTIAL
Basophils Absolute: 0
Basophils Relative: 0
Eosinophils Relative: 0
Lymphocytes Relative: 15
Neutro Abs: 7.1

## 2011-06-01 LAB — CBC
MCHC: 35
Platelets: 260
RDW: 15.3

## 2011-06-09 LAB — URINALYSIS, ROUTINE W REFLEX MICROSCOPIC
Bilirubin Urine: NEGATIVE
Glucose, UA: NEGATIVE
Hgb urine dipstick: NEGATIVE
Ketones, ur: 15 — AB
Nitrite: NEGATIVE
Protein, ur: 30 — AB
Specific Gravity, Urine: 1.019
Urobilinogen, UA: 1
pH: 8.5 — ABNORMAL HIGH

## 2011-06-09 LAB — CBC
HCT: 45.5
Hemoglobin: 15.2 — ABNORMAL HIGH
Platelets: 227
RDW: 14
WBC: 6.9

## 2011-06-09 LAB — COMPREHENSIVE METABOLIC PANEL WITH GFR
ALT: 28
AST: 23
Albumin: 3.9
Alkaline Phosphatase: 63
BUN: 7
CO2: 26
Calcium: 9.5
Chloride: 107
Creatinine, Ser: 0.67
GFR calc non Af Amer: >60
Glucose, Bld: 113 — ABNORMAL HIGH
Potassium: 3.2 — ABNORMAL LOW
Sodium: 142
Total Bilirubin: 0.8
Total Protein: 6.6

## 2011-06-09 LAB — DIFFERENTIAL
Basophils Absolute: 0
Basophils Relative: 0
Eosinophils Absolute: 0
Eosinophils Relative: 0
Lymphocytes Relative: 20
Lymphs Abs: 1.3
Monocytes Absolute: 0.2
Monocytes Relative: 3
Neutro Abs: 5.3
Neutrophils Relative %: 77

## 2011-06-09 LAB — URINE MICROSCOPIC-ADD ON

## 2011-06-11 LAB — CBC
HCT: 44.5 % (ref 36.0–46.0)
HCT: 48 % — ABNORMAL HIGH (ref 36.0–46.0)
Hemoglobin: 13.3 g/dL (ref 12.0–15.0)
Hemoglobin: 14.8 g/dL (ref 12.0–15.0)
Hemoglobin: 15.8 g/dL — ABNORMAL HIGH (ref 12.0–15.0)
Hemoglobin: 15.2 — ABNORMAL HIGH
MCHC: 33 g/dL (ref 30.0–36.0)
MCHC: 33.3 g/dL (ref 30.0–36.0)
MCV: 83.2 fL (ref 78.0–100.0)
MCV: 83.3 fL (ref 78.0–100.0)
Platelets: 253 K/uL (ref 150–400)
RBC: 5.34 MIL/uL — ABNORMAL HIGH (ref 3.87–5.11)
RBC: 5.76 MIL/uL — ABNORMAL HIGH (ref 3.87–5.11)
RBC: 5.49 — ABNORMAL HIGH
RDW: 13.8 % (ref 11.5–15.5)
RDW: 14.4 % (ref 11.5–15.5)
RDW: 14.3
WBC: 4.5 K/uL (ref 4.0–10.5)

## 2011-06-11 LAB — COMPREHENSIVE METABOLIC PANEL WITH GFR
ALT: 100 U/L — ABNORMAL HIGH (ref 0–35)
ALT: 25 U/L (ref 0–35)
AST: 126 U/L — ABNORMAL HIGH (ref 0–37)
AST: 35 U/L (ref 0–37)
Albumin: 3.2 g/dL — ABNORMAL LOW (ref 3.5–5.2)
Albumin: 3.7 g/dL (ref 3.5–5.2)
Alkaline Phosphatase: 56 U/L (ref 39–117)
Alkaline Phosphatase: 66 U/L (ref 39–117)
BUN: 2 mg/dL — ABNORMAL LOW (ref 6–23)
BUN: 4 mg/dL — ABNORMAL LOW (ref 6–23)
CO2: 25 meq/L (ref 19–32)
CO2: 29 meq/L (ref 19–32)
Calcium: 8.3 mg/dL — ABNORMAL LOW (ref 8.4–10.5)
Calcium: 9.3 mg/dL (ref 8.4–10.5)
Chloride: 105 meq/L (ref 96–112)
Chloride: 108 meq/L (ref 96–112)
Creatinine, Ser: 0.74 mg/dL (ref 0.4–1.2)
Creatinine, Ser: 0.86 mg/dL (ref 0.4–1.2)
GFR calc Af Amer: >60 mL/min (ref >60)
GFR calc Af Amer: >60 mL/min (ref >60)
GFR calc non Af Amer: >60 mL/min (ref >60)
GFR calc non Af Amer: >60 mL/min (ref >60)
Glucose, Bld: 117 mg/dL — ABNORMAL HIGH (ref 70–99)
Glucose, Bld: 96 mg/dL (ref 70–99)
Potassium: 3.1 meq/L — ABNORMAL LOW (ref 3.5–5.1)
Potassium: 3.6 meq/L (ref 3.5–5.1)
Sodium: 141 meq/L (ref 135–145)
Sodium: 143 meq/L (ref 135–145)
Total Bilirubin: 0.9 mg/dL (ref 0.3–1.2)
Total Bilirubin: 0.9 mg/dL (ref 0.3–1.2)
Total Protein: 5.7 g/dL — ABNORMAL LOW (ref 6.0–8.3)
Total Protein: 6.7 g/dL (ref 6.0–8.3)

## 2011-06-11 LAB — BASIC METABOLIC PANEL WITH GFR
BUN: 3 mg/dL — ABNORMAL LOW (ref 6–23)
BUN: 6 mg/dL (ref 6–23)
CO2: 24 meq/L (ref 19–32)
CO2: 27 meq/L (ref 19–32)
Calcium: 10 mg/dL (ref 8.4–10.5)
Calcium: 8.2 mg/dL — ABNORMAL LOW (ref 8.4–10.5)
Chloride: 104 meq/L (ref 96–112)
Chloride: 108 meq/L (ref 96–112)
Creatinine, Ser: 0.61 mg/dL (ref 0.4–1.2)
Creatinine, Ser: 0.72 mg/dL (ref 0.4–1.2)
GFR calc Af Amer: >60 mL/min (ref >60)
GFR calc Af Amer: >60 mL/min (ref >60)
GFR calc non Af Amer: >60 mL/min (ref >60)
GFR calc non Af Amer: >60 mL/min (ref >60)
Glucose, Bld: 108 mg/dL — ABNORMAL HIGH (ref 70–99)
Glucose, Bld: 139 mg/dL — ABNORMAL HIGH (ref 70–99)
Potassium: 3.7 meq/L (ref 3.5–5.1)
Potassium: 4.3 meq/L (ref 3.5–5.1)
Sodium: 140 meq/L (ref 135–145)
Sodium: 142 meq/L (ref 135–145)

## 2011-06-11 LAB — URINALYSIS, ROUTINE W REFLEX MICROSCOPIC
Bilirubin Urine: NEGATIVE
Glucose, UA: NEGATIVE mg/dL
Glucose, UA: NEGATIVE
Hgb urine dipstick: NEGATIVE
Ketones, ur: >80 mg/dL — AB
Nitrite: NEGATIVE
Protein, ur: 30 — AB
pH: 6 (ref 5.0–8.0)

## 2011-06-11 LAB — COMPREHENSIVE METABOLIC PANEL
ALT: 15
AST: 21
Alkaline Phosphatase: 88
CO2: 28
Glucose, Bld: 130 — ABNORMAL HIGH
Potassium: 4.1
Sodium: 138
Total Protein: 7.2

## 2011-06-11 LAB — DIFFERENTIAL
Basophils Absolute: 0
Basophils Relative: 0
Eosinophils Absolute: 0 10*3/uL (ref 0.0–0.7)
Eosinophils Absolute: 0 — ABNORMAL LOW
Eosinophils Relative: 0 % (ref 0–5)
Eosinophils Relative: 0
Lymphocytes Relative: 15
Lymphs Abs: 0.8 10*3/uL (ref 0.7–4.0)
Lymphs Abs: 1.1
Monocytes Absolute: 0.1 10*3/uL (ref 0.1–1.0)
Monocytes Absolute: 0.1
Monocytes Relative: 3 % (ref 3–12)
Monocytes Relative: 2 — ABNORMAL LOW
Neutro Abs: 6.4
Neutrophils Relative %: 83 — ABNORMAL HIGH

## 2011-06-11 LAB — URINE CULTURE

## 2011-06-11 LAB — URINE MICROSCOPIC-ADD ON

## 2011-06-11 LAB — MAGNESIUM: Magnesium: 1.8 mg/dL (ref 1.5–2.5)

## 2011-06-11 LAB — PROTIME-INR
INR: 1 (ref 0.00–1.49)
Prothrombin Time: 13 s (ref 11.6–15.2)

## 2011-06-11 LAB — LIPASE, BLOOD: Lipase: 21 U/L (ref 11–59)

## 2011-06-23 LAB — BASIC METABOLIC PANEL
BUN: 11
Calcium: 9.3
Creatinine, Ser: 0.97
GFR calc non Af Amer: >60
Glucose, Bld: 114 — ABNORMAL HIGH
Sodium: 139

## 2011-06-24 LAB — CARDIAC PANEL(CRET KIN+CKTOT+MB+TROPI)
CK, MB: 1.3
Relative Index: INVALID
Total CK: 68
Total CK: 98
Troponin I: 0.02

## 2011-06-24 LAB — BASIC METABOLIC PANEL
CO2: 32
Calcium: 8.8
Glucose, Bld: 94
Sodium: 139

## 2011-06-24 LAB — COMPREHENSIVE METABOLIC PANEL
BUN: 7
CO2: 31
Calcium: 9.1
Creatinine, Ser: 0.66
GFR calc non Af Amer: >60
Glucose, Bld: 106 — ABNORMAL HIGH
Total Protein: 6

## 2011-06-24 LAB — LIPID PANEL
Cholesterol: 190
HDL: 29 — ABNORMAL LOW
LDL Cholesterol: 113 — ABNORMAL HIGH
Total CHOL/HDL Ratio: 6.6
Triglycerides: 241 — ABNORMAL HIGH
VLDL: 48 — ABNORMAL HIGH

## 2011-06-24 LAB — CBC
HCT: 37.3
Hemoglobin: 12.4
MCHC: 33.4
MCV: 82.1
RBC: 4.54
RDW: 14.5 — ABNORMAL HIGH

## 2011-06-24 LAB — APTT: aPTT: 34

## 2011-06-24 LAB — MAGNESIUM: Magnesium: 2.2

## 2011-06-24 LAB — TSH: TSH: 0.315 — ABNORMAL LOW

## 2011-06-24 LAB — PROTIME-INR: INR: 1

## 2011-06-24 LAB — D-DIMER, QUANTITATIVE: D-Dimer, Quant: <0.22

## 2012-03-02 IMAGING — CT CT L SPINE W/ CM
4 of 10 series · 12 of 33 positions shown, 14 images · IV contrast (omnipaque)
Comparison: Outside MRI study from [HOSPITAL].

MYELOGRAM INJECTION
TECHNIQUE: Informed consent was obtained from the patient prior to
the procedure, including potential complications of headache,
allergy, infection and pain. Specific instructions were given
regarding 24 hour bedrest post procedure to prevent post-LP
headache.  A timeout procedure was performed.  With the patient
prone, the lower back was prepped with Betadine.  1% Lidocaine was
used for local anesthesia.  Lumbar puncture was performed by the
radiologist at the L3-4 level using a 22 gauge needle with return
of clear CSF.  15 cc of Omnipaque 180 was injected into the
subarachnoid space .
CLINICAL DATA: Low back pain.
TECHNIQUE: Multidetector CT imaging of the lumbar spine was
performed following myelography.  Multiplanar CT image
reconstructions were also generated.

[Series 2: l spine bone · axial · 0.27mm/px · z∈[+55,+120]mm · 2 of 80 slices shown, 3 images]
[im 27/80  soft-tissue]
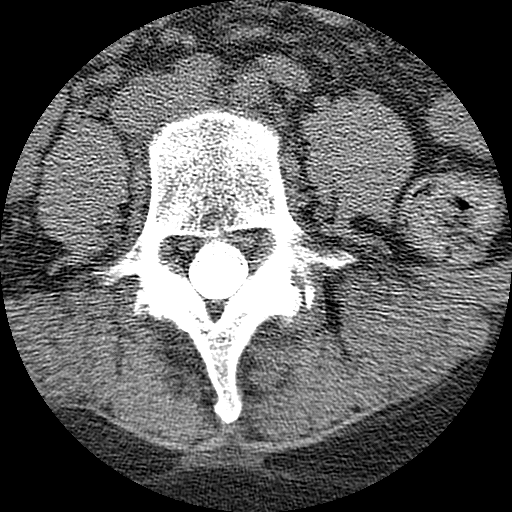
[im 27/80  bone]
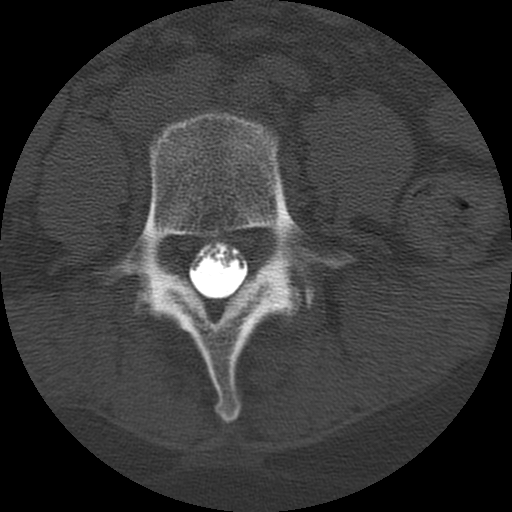
[im 53/80  bone]
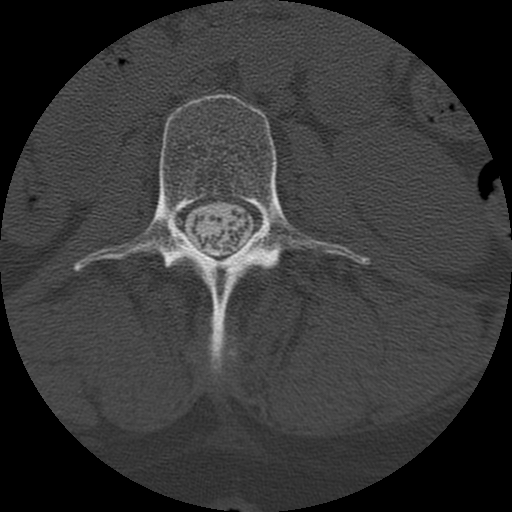

[Series 3: l spine soft · axial · 0.27mm/px · z∈[+55,+120]mm · 2 of 80 slices shown]
[im 27/80  soft-tissue]
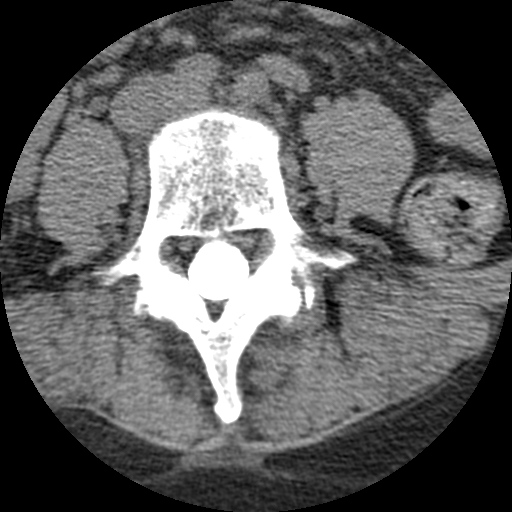
[im 53/80  soft-tissue]
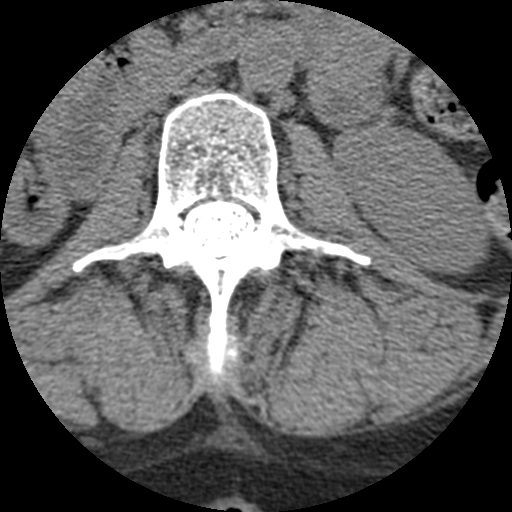

[Series 103: sag · sagittal · 0.40mm/px · 5 of 40 slices shown, 6 images]
[im 14/40  bone]
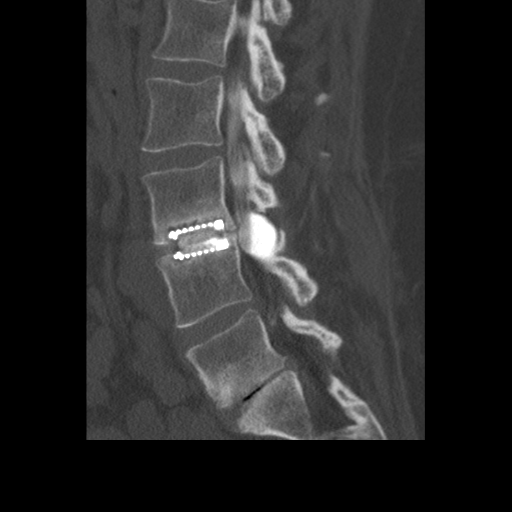
[im 17/40  bone]
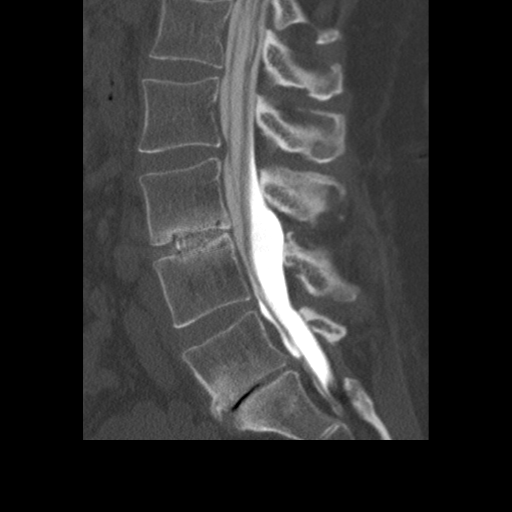
[im 20/40  soft-tissue]
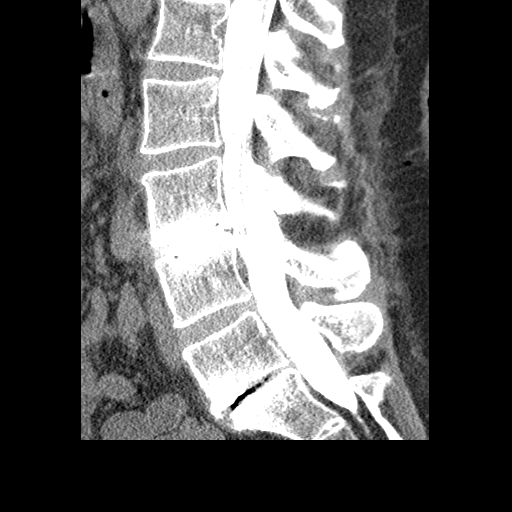
[im 20/40  bone]
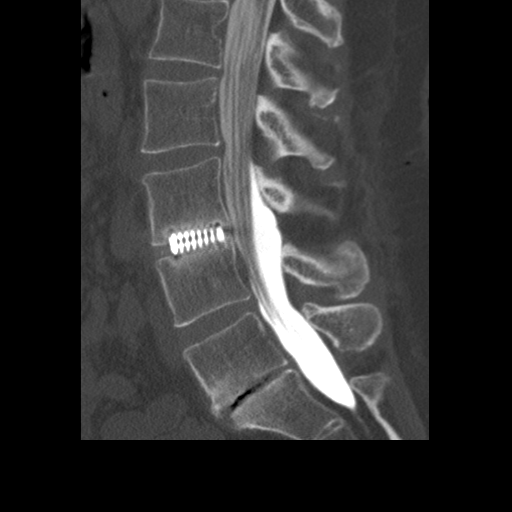
[im 23/40  bone]
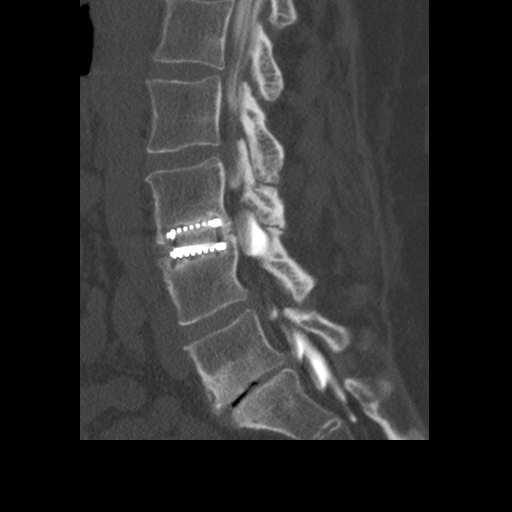
[im 27/40  bone]
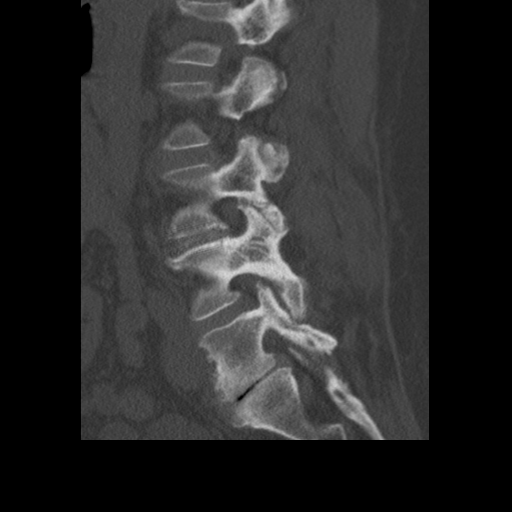

[Series 104: cor · coronal · 0.40mm/px · 3 of 40 slices shown]
[im 8/40  bone]
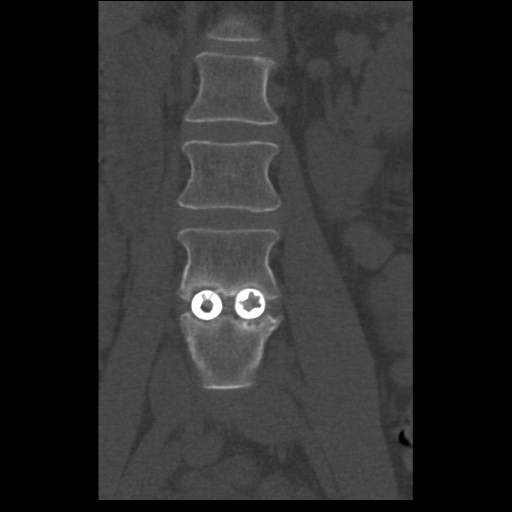
[im 16/40  bone]
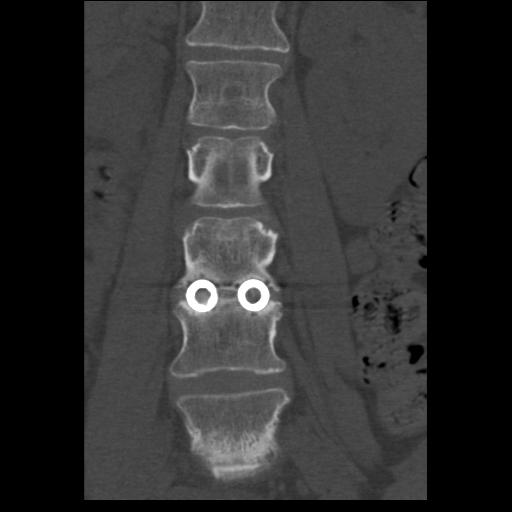
[im 24/40  bone]
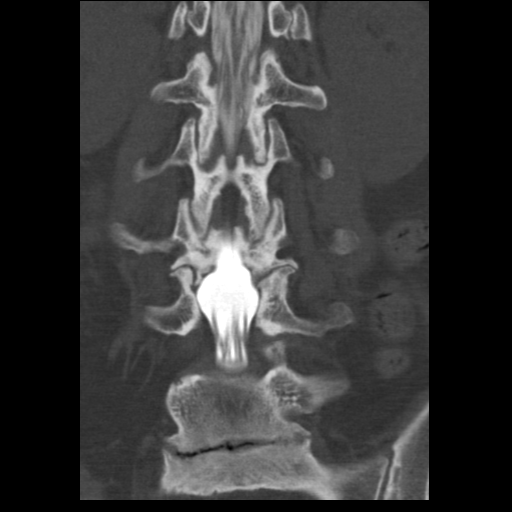

[12 of 33 positions shown; findings below may reference images not displayed]

IMPRESSION: Successful injection of  intrathecal contrast for myelography.

MYELOGRAM LUMBAR
FINDINGS: Good opacification of the lumbar subarachnoid space.
Shallow ventral defect at L2-3.  Advanced disc space narrowing at
L5-S1.  No nerve root cut off or spinal stenosis.

Previous L3-4 Ray cage fusion.  Mild subsidence of the left and
right cages.    Anatomic alignment is present without abnormal
movement.

Fluoroscopy Time: 1.07 minutes
IMPRESSION: As above.

CT MYELOGRAPHY LUMBAR SPINE
FINDINGS: There are no prevertebral or paraspinous masses.

L1-2: Normal.

L2-3: Shallow central protrusion, non compressive.  Mild facet
arthropathy.

L3-4:   No evidence for posterolateral fusion.  Vacuum joint
phenomenon is observed in the  left greater than right facet joints
which are moderately degenerated, but not fused.  Lucencies
surround both interbody cages, suggesting pseudarthrosis or
nonunion.  Anatomic alignment is present.  There is no significant
posterior osseous spurring or foraminal narrowing.   Mild bony
neural foraminal narrowing without   L3 nerve root encroachment.

L4-5: Mild annular bulging.  Moderate facet arthropathy.  No
compressive lesion.

L5-S1: Severe disc space narrowing with endplate reactive change
and vacuum disc phenomenon.  Shallow central protrusion.  This
contacts, but does not displace, the S1 nerve roots.  Mild to
moderate facet arthropathy.  Mild neural foraminal narrowing, left
greater than right, but clear-cut L5 nerve root encroachment is not
established.

The L3-4 interspace is obscured on the outside MRI due to
susceptibility artifact.  The appearance of the L5-S1 disc space is
similar.
IMPRESSION: No solid posterolateral or interbody fusion at L3-4.  Ray cages
remain in satisfactory position within the interspace with anatomic
alignment; no dynamic instability on standing flexion/extension
views.

Severe disc space narrowing at L5-S1 with shallow central
protrusion, non compressive.

Shallow central protrusion at L2-3, non compressive.

Advanced multilevel lower lumbar facet arthropathy.

## 2013-10-07 DEATH — deceased

## 2014-06-17 ENCOUNTER — Other Ambulatory Visit: Payer: Self-pay | Admitting: Internal Medicine

## 2014-06-17 DIAGNOSIS — R945 Abnormal results of liver function studies: Secondary | ICD-10-CM

## 2014-07-03 ENCOUNTER — Other Ambulatory Visit: Payer: Self-pay

## 2014-07-10 ENCOUNTER — Other Ambulatory Visit: Payer: Self-pay

## 2014-07-11 ENCOUNTER — Ambulatory Visit
Admission: RE | Admit: 2014-07-11 | Discharge: 2014-07-11 | Disposition: A | Discharge status: Home / Self Care | Payer: BC Managed Care – PPO | Source: Ambulatory Visit | Attending: Internal Medicine | Admitting: Internal Medicine

## 2014-07-11 DIAGNOSIS — R945 Abnormal results of liver function studies: Secondary | ICD-10-CM

## 2014-10-07 DEATH — deceased

## 2016-09-09 IMAGING — US US ABDOMEN COMPLETE
1 series · 14 of 25 positions shown · non-contrast
Comparison: 05/14/2009

CLINICAL DATA: Abnormal liver function tests.

EXAM:
ULTRASOUND ABDOMEN COMPLETE

[Series 1: us abdomen complete · 0.39mm/px · 14 of 72 slices shown]
[im 1/72]
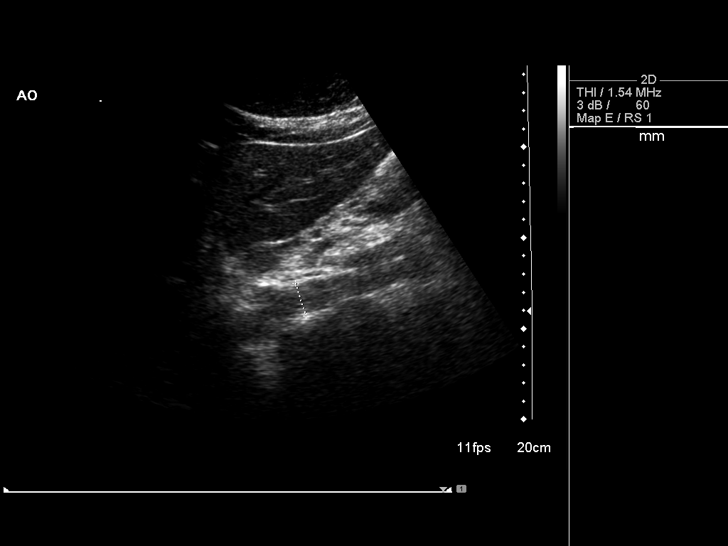
[im 6/72]
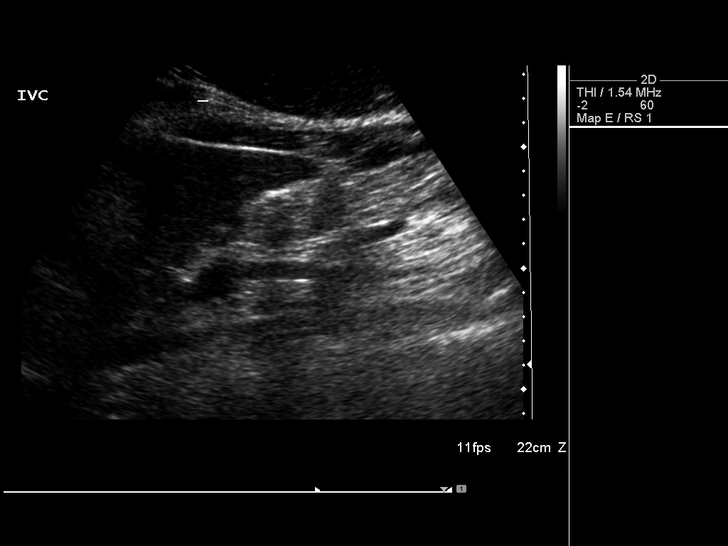
[im 12/72]
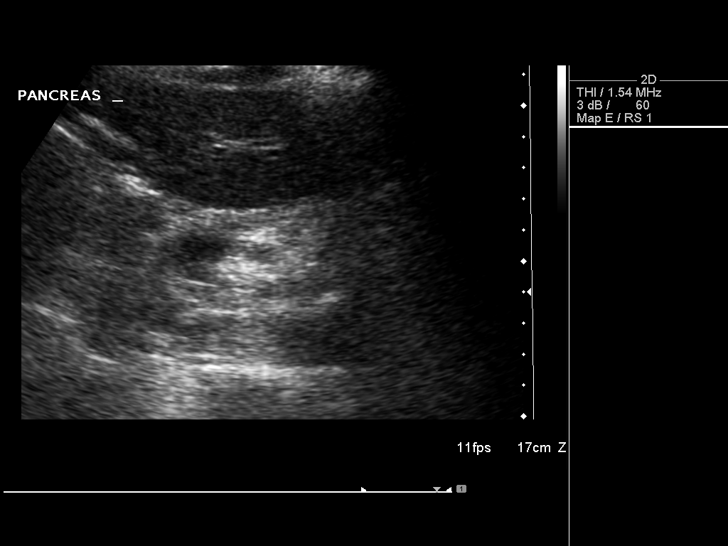
[im 18/72]
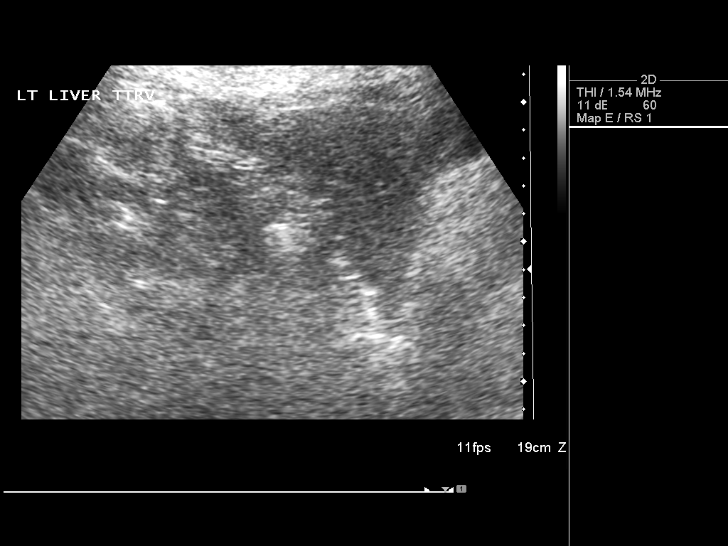
[im 24/72]
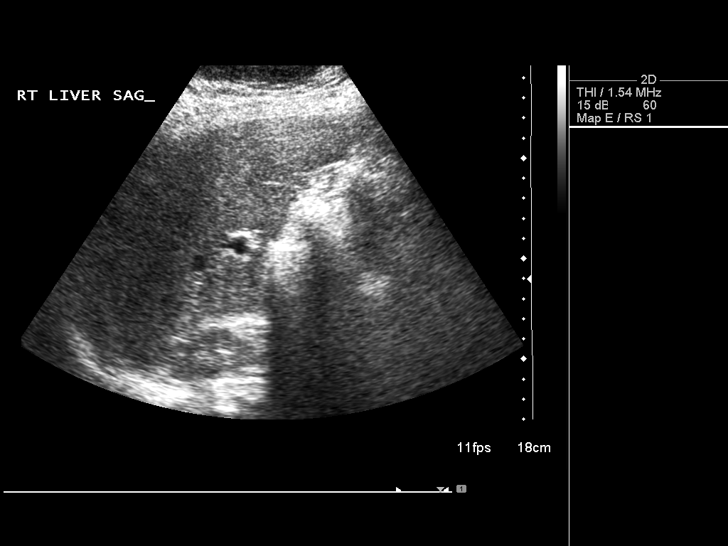
[im 27/72]
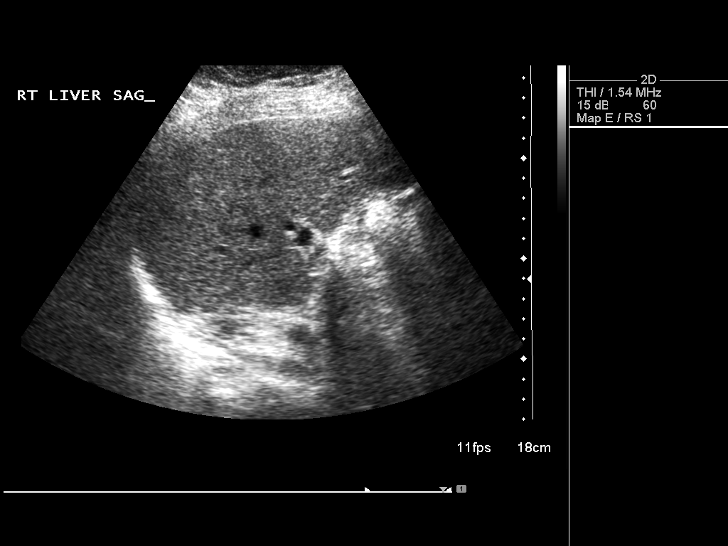
[im 33/72]
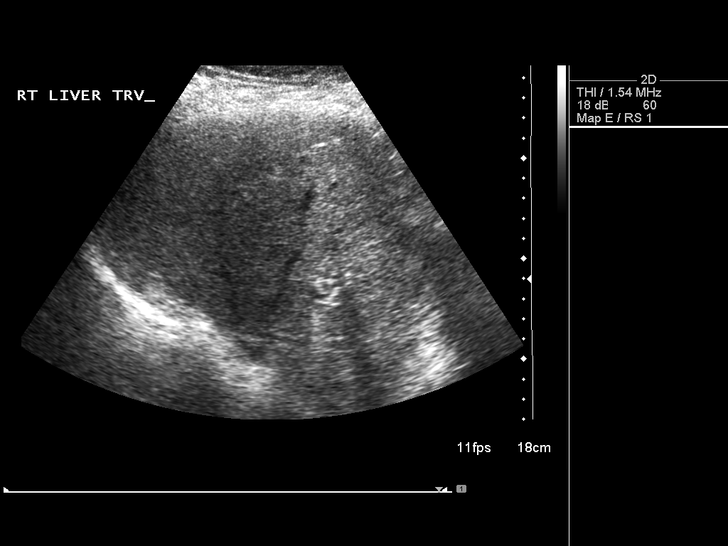
[im 39/72]
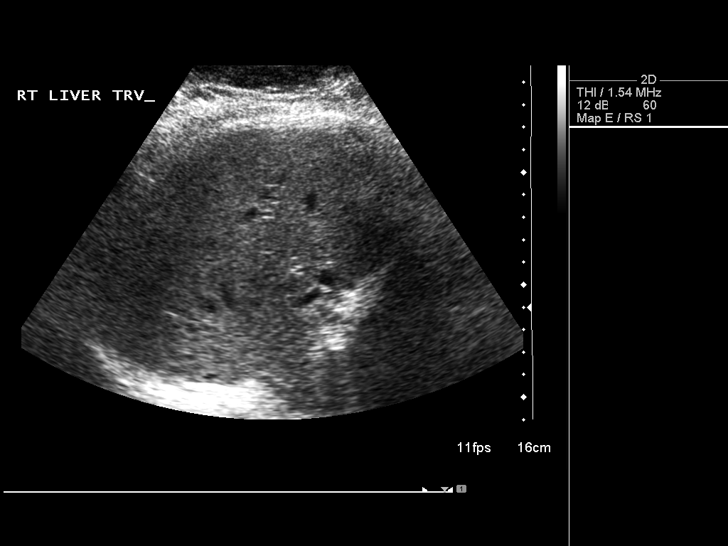
[im 45/72]
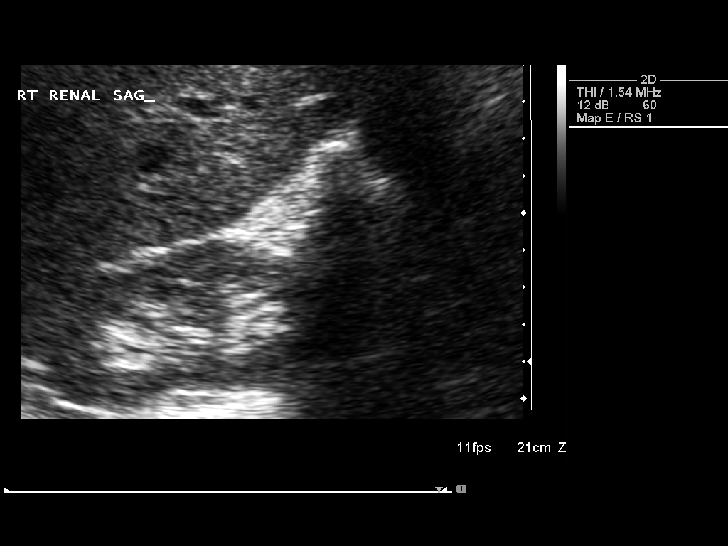
[im 48/72]
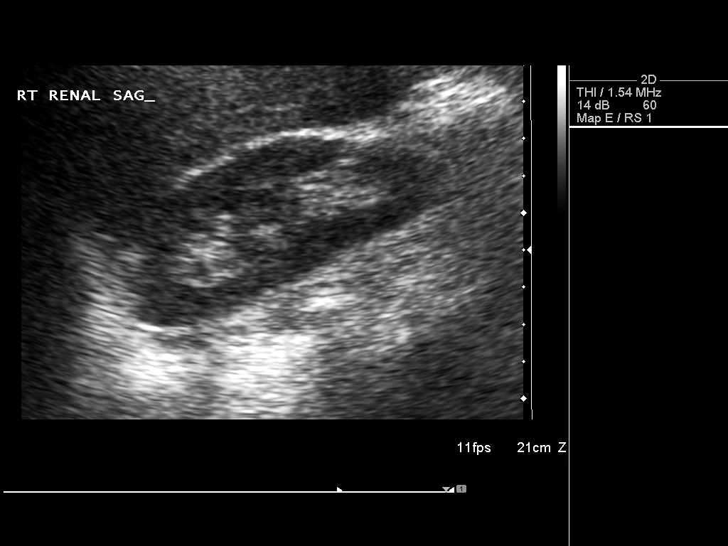
[im 54/72]
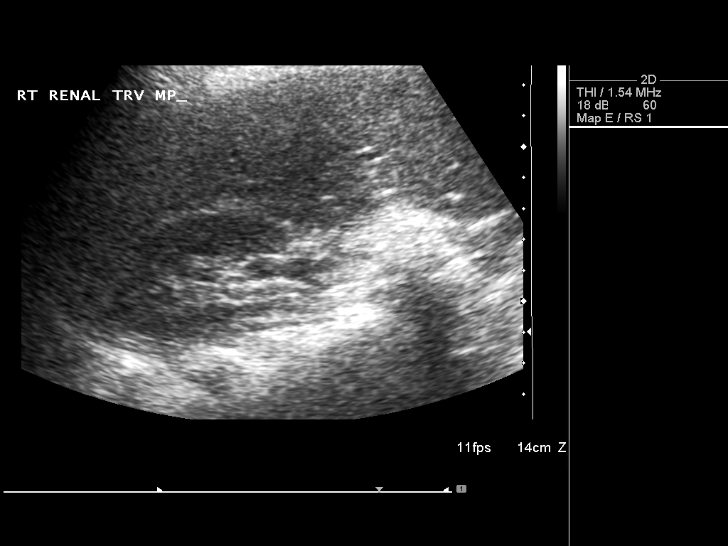
[im 60/72]
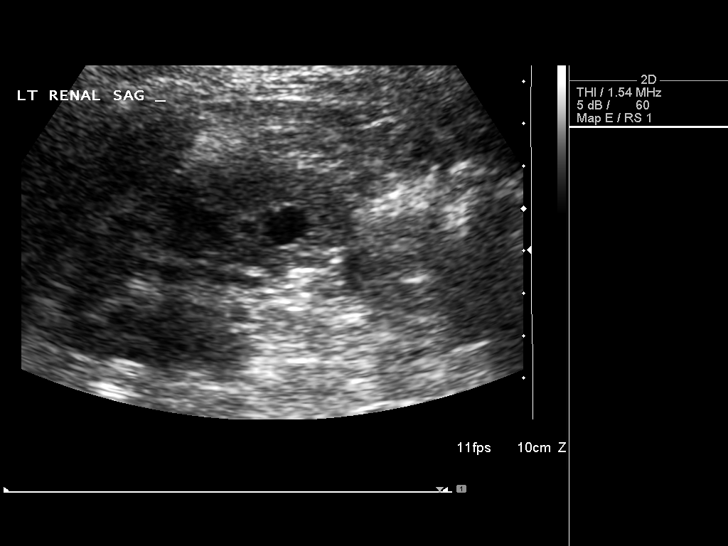
[im 66/72]
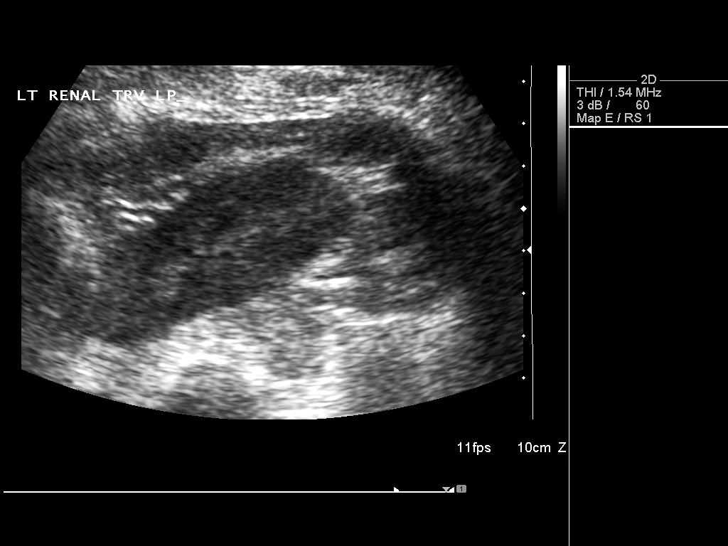
[im 72/72]
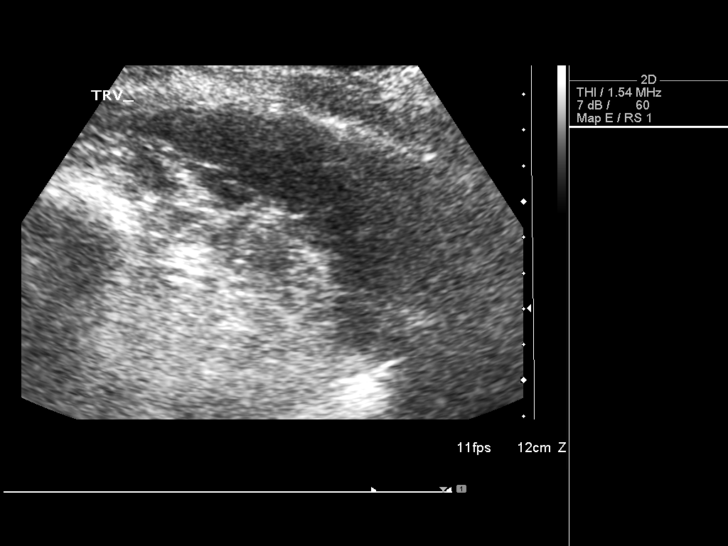

[14 of 25 positions shown; findings below may reference images not displayed]

FINDINGS: Gallbladder: Absent.

Common bile duct: Diameter: 10 mm, similar to prior and likely
related to prior cholecystectomy.

Liver: Mildly inhomogeneous diffusely, similar to prior, without
focal abnormality identified.

IVC: No abnormality visualized.

Pancreas: Not well visualized due to bowel gas. Visualized portion
unremarkable.

Spleen: Size and appearance within normal limits.

Right Kidney: Length: 9.1 cm. Echogenicity within normal limits. No
mass or hydronephrosis visualized.

Left Kidney: Length: 9.7 cm. Echogenicity within normal limits. No
solid mass or hydronephrosis visualized. 10 mm interpolar cyst
noted.

Abdominal aorta: No aneurysm visualized. Common iliac arteries
obscured by bowel gas.

Other findings: None.
IMPRESSION: Mildly inhomogeneous liver echotexture, similar to the prior study
and nonspecific. No focal abnormality identified.
